# Patient Record
Sex: Female | Born: 1955 | Race: White | Hispanic: No | Marital: Married | Smoking: Never smoker
Health system: Southern US, Community
[De-identification: ages and names within clinical notes are randomized; demographics above are authoritative.]

## PROBLEM LIST (undated history)

## (undated) DIAGNOSIS — J189 Pneumonia, unspecified organism: Secondary | ICD-10-CM

## (undated) DIAGNOSIS — C801 Malignant (primary) neoplasm, unspecified: Secondary | ICD-10-CM

## (undated) DIAGNOSIS — F419 Anxiety disorder, unspecified: Secondary | ICD-10-CM

## (undated) DIAGNOSIS — M2540 Effusion, unspecified joint: Secondary | ICD-10-CM

## (undated) DIAGNOSIS — K5900 Constipation, unspecified: Secondary | ICD-10-CM

## (undated) DIAGNOSIS — M1990 Unspecified osteoarthritis, unspecified site: Secondary | ICD-10-CM

## (undated) DIAGNOSIS — E785 Hyperlipidemia, unspecified: Secondary | ICD-10-CM

## (undated) DIAGNOSIS — Z889 Allergy status to unspecified drugs, medicaments and biological substances status: Secondary | ICD-10-CM

## (undated) DIAGNOSIS — G4700 Insomnia, unspecified: Secondary | ICD-10-CM

## (undated) DIAGNOSIS — M549 Dorsalgia, unspecified: Secondary | ICD-10-CM

## (undated) DIAGNOSIS — R51 Headache: Secondary | ICD-10-CM

## (undated) DIAGNOSIS — J45909 Unspecified asthma, uncomplicated: Secondary | ICD-10-CM

## (undated) DIAGNOSIS — R42 Dizziness and giddiness: Secondary | ICD-10-CM

## (undated) DIAGNOSIS — J40 Bronchitis, not specified as acute or chronic: Secondary | ICD-10-CM

## (undated) DIAGNOSIS — M2550 Pain in unspecified joint: Secondary | ICD-10-CM

## (undated) HISTORY — PX: CARPAL TUNNEL RELEASE: SHX101

## (undated) HISTORY — PX: FLEXIBLE SIGMOIDOSCOPY: SHX1649

## (undated) HISTORY — PX: COLONOSCOPY: SHX174

## (undated) HISTORY — PX: EYE SURGERY: SHX253

## (undated) HISTORY — PX: TONSILLECTOMY: SUR1361

---

## 1999-04-21 ENCOUNTER — Ambulatory Visit (HOSPITAL_BASED_OUTPATIENT_CLINIC_OR_DEPARTMENT_OTHER): Admission: RE | Admit: 1999-04-21 | Discharge: 1999-04-21 | Payer: Self-pay | Admitting: Orthopedic Surgery

## 2004-05-31 ENCOUNTER — Ambulatory Visit (HOSPITAL_COMMUNITY): Admission: RE | Admit: 2004-05-31 | Discharge: 2004-05-31 | Payer: Self-pay | Admitting: Gastroenterology

## 2006-02-17 ENCOUNTER — Encounter: Admission: RE | Admit: 2006-02-17 | Discharge: 2006-02-17 | Payer: Self-pay | Admitting: Orthopedic Surgery

## 2009-06-26 ENCOUNTER — Ambulatory Visit: Payer: Self-pay | Admitting: Gastroenterology

## 2009-07-14 ENCOUNTER — Telehealth: Payer: Self-pay | Admitting: Gastroenterology

## 2009-07-14 ENCOUNTER — Ambulatory Visit: Payer: Self-pay | Admitting: Gastroenterology

## 2009-07-15 ENCOUNTER — Ambulatory Visit (HOSPITAL_COMMUNITY): Admission: RE | Admit: 2009-07-15 | Discharge: 2009-07-15 | Payer: Self-pay | Admitting: Gastroenterology

## 2010-12-19 DIAGNOSIS — J40 Bronchitis, not specified as acute or chronic: Secondary | ICD-10-CM

## 2010-12-19 DIAGNOSIS — J189 Pneumonia, unspecified organism: Secondary | ICD-10-CM

## 2010-12-19 HISTORY — DX: Bronchitis, not specified as acute or chronic: J40

## 2010-12-19 HISTORY — DX: Pneumonia, unspecified organism: J18.9

## 2011-01-09 ENCOUNTER — Encounter: Payer: Self-pay | Admitting: Orthopedic Surgery

## 2011-12-20 DIAGNOSIS — K219 Gastro-esophageal reflux disease without esophagitis: Secondary | ICD-10-CM

## 2011-12-20 HISTORY — DX: Gastro-esophageal reflux disease without esophagitis: K21.9

## 2012-05-21 NOTE — H&P (Signed)
Morgan Johnston 05/21/2012 8:20 AM Location: [facility] Patient #: [id number] DOB: 1956/03/24 Married / Language: [place] / Race: White Female   History of Present Illness(Morgan Johnston Dierdre Highman, PA-C; 05/21/2012 2:20 PM) The patient is a 56 year old female who comes in today for a preoperative History and Physical. 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 Debria Garret D. Shon Baton, MD at [facility] on Thursday, June 07, 2012 at 730AM . Please see the hospital record for complete dictated history and physical.    Allergies(Sharon Gillian Shields; 05/21/2012 10:30 AM) AUGMENTIN. 11/28/2005 CODEINE. 03/25/2005 UPSETS STOMACH   Social History(Sharon Gillian Shields; 05/21/2012 10:31 AM) Tobacco use. Never smoker.   Medication History(Sharon Gillian Shields; 05/21/2012 10:33 AM) Lyrica (75MG  Capsule, 1 Oral two times daily, Taken starting 05/11/2012) Active. (faxed [facility] [phone]) Simvastatin (10MG  Tablet, Oral daily) Active. Allegra Allergy ( Oral) Specific dose unknown - Active. Flexeril (10MG  Tablet, Oral as needed) Active. Calcium 600 ( Oral) Specific dose unknown - Active. Aleve (220MG  Capsule, Oral) Active.   Past Surgical History(Sharon Gillian Shields; 05/21/2012 10:34 AM) Tonsillectomy Carpal Tunnel Surgery - Both   Other Problems(Sharon Gillian Shields; 05/21/2012 10:34 AM) Hypercholesterolemia   Vitals(Sharon Gillian Shields; 05/21/2012 10:30 AM) 05/21/2012 10:29 AM Weight: 135 lb Height: 60 in Body Surface Area: 1.61 m Body Mass Index: 26.37 kg/m Pulse: 65 (Regular) BP: 116/77 (Sitting, Left Arm, Standard)    Physical Exam(Derion Kreiter J Jaedon Siler, PA-C; 05/21/2012 11:28 AM) The physical exam findings are as follows:   General General Appearance- pleasant. Not in acute distress. Orientation- Oriented X3. Build & Nutrition- Well nourished and Well developed. Posture- Normal posture. Gait- Normal. Mental Status-  Alert.   Integumentary General Characteristics:Surgical Scars- no surgical scar evidence of previous cervical surgery. Cervical Spine- Skin examination of the cervical spine is without deformity, skin lesions, lacerations or abrasions.   Head and Neck Neck Global Assessment- supple. no lymphadenopathy and no nucchal rigidty.   Eye Pupil- Bilateral- Normal, Direct reaction to light normal, Equal and Regular. Motion- Bilateral- EOMI.   Chest and Lung Exam Auscultation: Breath sounds:- Clear.   Cardiovascular Auscultation:Rhythm- Regular rate and rhythm. Heart Sounds- Normal heart sounds.   Abdomen Palpation/Percussion:Palpation and Percussion of the abdomen reveal - Non Tender, No Rebound tenderness and Soft.   Peripheral Vascular Upper Extremity: Palpation:Radial pulse- Bilateral- 2+.   Neurologic Sensation:Upper Extremity- Bilateral- sensation is intact in the upper extremity. Reflexes:Biceps Reflex- Bilateral- 2+. Brachioradialis Reflex- Bilateral- 2+. Triceps Reflex- Bilateral- 2+. Patellar Reflex- Bilateral- 2+. Achilles Reflex- Bilateral- 2+. Babinski- Bilateral- Babinski not present. Clonus- Bilateral- clonus not present. Hoffman's Sign- Bilateral- Hoffman's sign not present.   Musculoskeletal Spine/Ribs/Pelvis Cervical Spine : Inspection and Palpation:Tenderness- no bony tenderness to palpation and generalized tenderness about the cervical spine. bony/soft tissue palpation of the cervical spine and shoulders does not recreate their typical pain. Strength and Tone: Strength:Deltoid- Bilateral- 5/5. Biceps- Bilateral- 5/5. Triceps- Bilateral- 5/5. Wrist Extension- Bilateral- 5/5. Hand Grip- Bilateral- 5/5. Heel walk- Bilateral- able to heel walk without difficulty. Toe Walk- Bilateral- able to walk on toes without difficulty. Heel-Toe Walk- Bilateral- able to heel-toe walk without difficulty. ROM-  Flexion- Full. Extension- Full. Left Lateral Flexion - Full. Right Lateral Flexion - Full. Left Rotation - Full. Right Rotation - Full. Pain:- neither flexion or extension is more painful than the other. Special Testing- axial compression test negative and cross chest impingement test negative. Non-Anatomic Signs- No non-anatomic signs present. Upper Extremity Range of Motion:-  No truesholder pain with IR/ER of the shoulders.   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 Risks/benefits/alternatives to surgery/expectations following the procedure have been reviewed with the patient by Dr. Shon Baton.   MRI of the cervical spine dated 03/08/12 demonstrates C3-4: moderate to severe right facet arthrosis. Minimal disc bulge. Minimal right foraminal stenosis. Central canal and left foramina are patent C4-5: mild anterolisthesis of C4 on 5. Moderate to severe left facet arthrosis. Minimal left foraminal narrowing. Central canal and right neural foramina are patent.  C5-6: mild flattening of the ventral thecal sac and ventral cord. Moderate left and mild right foraminal stenosis  She has been fitted for a ASPEN collar. She knows to bring this with her on the morning of surgery. She has not completed her pre-op hospital requirements and is scheduled to do so next week. All of her questions have been encouraged, addressed and answered. Plan, at this time is to proceed with surgery as scheduled.   Signed electronically by Morgan Maine, PA-C (05/21/2012 2:21 PM)  Morgan Johnston 03/20/2012 2:59 PM Location:  [facility] Patient #: [id number] DOB: January 21, 1956 Married / Language: [place] / Race: White Female   History of Present Illness(Sharon Gillian Shields; 03/20/2012 3:01 PM) The patient is a 56 year old female who presents today for follow up of their neck. The patient is being followed for their neck pain. The patient feels that they are doing poorly. The patient presents today following MRI (pre-op). Note for "Follow-up Neck": To discuss surgery in detail    Subjective Transcription(DAHARI Sheela Stack, MD; 03/23/2012 12:44 PM)  She returns today for follow up. I have had an opportunity to review her MRI with her. While there is no significant change from last year's MRI, clinically her symptoms have progressed. Despite injection therapy, physical therapy, observation, pain management she continues to deteriorate. Her quality of life continues to hinder. The patient had cervical spondylitic radiculopathy. It was helped with the C6 nerve block and it has returned.    Allergies(Lori W Lamb; 03/20/2012 3:31 PM) AUGMENTIN. 11/28/2005 CODEINE. 03/25/2005 UPSETS STOMACH   Social History(Lori W Randa Lynn; 03/20/2012 3:31 PM) Tobacco use. Never smoker.   Medication History(Lori W Randa Lynn; 03/20/2012 3:32 PM) Joyce Copa Allergy ( Oral) Specific dose unknown - Active. Simvastatin (10MG  Tablet, Oral) Active. Lyrica (75MG  Capsule, 1 Oral two times daily, Taken 01/16/2012 to 02/15/2012) Inactive.   Past Surgical History(Lori Zipporah Plants; 03/20/2012 3:32 PM) Tonsillectomy   Assessment & Plan(Lori W Randa Lynn; 03/20/2012 3:34 PM) Degeneration, cervical disc (722.4)  Cervicalgia (723.1) Current Plans l Restarted Lyrica 75MG , 1 Capsule two times daily, #60, 30 days starting 03/20/2012, No Refill. l Started Flexeril 10MG , 1 (one) Tablet three times daily, #30, 20 days starting 03/20/2012, No Refill.   Plans Transcription(DAHARI Sheela Stack, MD; 03/23/2012 12:44 PM)  At this point I do think that the  C5-6 ACDF is warranted. Because of the instability pattern she has at C4-5 I would also like to include that as the likelihood of that going on to become problematic in the future in my estimation is significant enough. We have talked about observing it and after discussing with the patient the pros and cons of  two level versus one level she is in favor of proceeding with the two level as am I. We reviewed the risks which include infection, bleeding, nerve damage, death, stroke, paralysis, failure to heal, need for further surgery, ongoing or worse pain, throat pain, swallowing difficulties, hoarseness in the voice, nonunion, adjacent segment disease, need for posterior surgery. All of her questions were encouraged and answered. 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    Because this is a multilevel procedure, I do have concerns about not fusing and so I will use an external bone stimulator after surgery to enhance her fusion rate.    Miscellaneous Transcription(DAHARI Sheela Stack, MD; 03/23/2012 12:44 PM)  Alvy Beal, MD/MMK    T: 03/21/12  D: 03/20/12      Signed electronically by Alvy Beal, MD (03/23/2012 2:39 PM)

## 2012-05-30 ENCOUNTER — Inpatient Hospital Stay (HOSPITAL_COMMUNITY): Admission: RE | Admit: 2012-05-30 | Discharge: 2012-05-30 | Payer: Self-pay | Source: Ambulatory Visit

## 2012-05-30 ENCOUNTER — Encounter (HOSPITAL_COMMUNITY): Payer: Self-pay | Admitting: *Deleted

## 2012-05-30 ENCOUNTER — Encounter (HOSPITAL_COMMUNITY): Payer: Self-pay | Admitting: Respiratory Therapy

## 2012-05-30 NOTE — Progress Notes (Signed)
Requested CXR from [facility] @ [facility]

## 2012-05-30 NOTE — Progress Notes (Signed)
Confirmed lab appointment with pt for 05/31/12 @ 1600

## 2012-05-30 NOTE — Progress Notes (Signed)
In 2003 pt states she had minor chest pain and primary MD sent her to cardiologist with [facility]--stress test/echo were done and everything was normal.Was told that it was anxiety. Occ mild chest pain -last time about a week ago/doesn't have any ntg  Denies ever having a heart cath  Medical MD is [facility] at [facility]  Last EKG many yrs ago CXR in 2012 d/t pneumonia and then had a follow up afterward 2013 @ [employer]

## 2012-05-30 NOTE — Progress Notes (Signed)
Notified Dr.Ross's nurse of pt c/o occasional mild chest pain;wanting to verify that he has been told this by pt and that he is aware that she is going to be having surgery on the 20th.Awaiting return call

## 2012-05-31 ENCOUNTER — Encounter (HOSPITAL_COMMUNITY)
Admission: RE | Admit: 2012-05-31 | Discharge: 2012-05-31 | Disposition: A | Discharge status: Home / Self Care | Payer: BC Managed Care – PPO | Source: Ambulatory Visit | Attending: Orthopedic Surgery | Admitting: Orthopedic Surgery

## 2012-05-31 ENCOUNTER — Encounter (HOSPITAL_COMMUNITY)
Admission: RE | Admit: 2012-05-31 | Discharge: 2012-05-31 | Disposition: A | Discharge status: Home / Self Care | Payer: BC Managed Care – PPO | Source: Ambulatory Visit | Attending: Physician Assistant | Admitting: Physician Assistant

## 2012-05-31 LAB — CBC
HCT: 43.2 % (ref 36.0–46.0)
MCH: 31.1 pg (ref 26.0–34.0)
MCHC: 34.7 g/dL (ref 30.0–36.0)
MCV: 89.6 fL (ref 78.0–100.0)
Platelets: 239 10*3/uL (ref 150–400)
RDW: 12.2 % (ref 11.5–15.5)
WBC: 7.8 10*3/uL (ref 4.0–10.5)

## 2012-05-31 LAB — BASIC METABOLIC PANEL
BUN: 13 mg/dL (ref 6–23)
CO2: 26 mEq/L (ref 19–32)
Calcium: 10 mg/dL (ref 8.4–10.5)
Chloride: 101 mEq/L (ref 96–112)
Creatinine, Ser: 0.71 mg/dL (ref 0.50–1.10)

## 2012-05-31 NOTE — Progress Notes (Signed)
DR LITTLE OFFICE CALLED FOR EKG DONE TODAY PT TO SEE DR TRACY TURNER ON 06/01/12 FOR PROP CLEARANCE. PLEASE CALLAND CHECK FOR NOTES, ANY TEST FIRST OF WEEK

## 2012-05-31 NOTE — Pre-Procedure Instructions (Addendum)
20 Morgan Johnston  05/31/2012   Your procedure is scheduled on: 06/07/12  Report to [facility] at 530 AM.  Call this number if you have problems the morning of surgery: [phone]   Remember:   Do not eat food:OR DRINK After Midnight.  .    Take these medicines the morning of surgery with A SIP OF WATER: flexerill, allegra, lyrica  STOP anaprox   Do not wear jewelry, make-up or nail polish.  Do not wear lotions, powders, or perfumes. You may wear deodorant.  Do not shave 48 hours prior to surgery. Men may shave face and neck.  Do not bring valuables to the hospital.  Contacts, dentures or bridgework may not be worn into surgery.  Leave suitcase in the car. After surgery it may be brought to your room.  For patients admitted to the hospital, checkout time is 11:00 AM the day of discharge.   Patients discharged the day of surgery will not be allowed to drive home.  Name and phone number of your driver:Morgan Johnston [phone]  Special Instructions: Incentive Spirometry - Practice and bring it with you on the day of surgery. and CHG Shower Use Special Wash: 1/2 bottle night before surgery and 1/2 bottle morning of surgery.   Please read over the following fact sheets that you were given: Pain Booklet, Coughing and Deep Breathing, MRSA Information and Surgical Site Infection Prevention

## 2012-06-01 NOTE — Consult Note (Addendum)
Anesthesia Chart Review:  Patient is a 55 year old female scheduled for C4-5, C5-6 ACDF on 06/07/12 by Dr. Shon Baton.  History includes non-smoker, HLD, PNA '12, anxiety, dizziness, insomnia, headaches, arthritis, back pain.  During her phone interview earlier this week she also reported occasional mild chest pain.  Her PCP Dr. Duane Lope was notified, and she is now scheduled to see Cardiologist Dr. Mayford Knife on 06/01/12.    Labs noted.  CXR from 10/10/11 [facility]) showed no acute findings.  EKG on 05/31/12 [facility]) showed NSR, nonspecific T wave changes in III, aVF.  Will follow-up on Cardiology records when available.  Addendum: 06/05/12 1000  Reviewed 06/01/12 note from Dr. Mayford Knife.  She ordered a stress test.  I called [facility], and they report that a stress test is being done on 06/05/12 @ 1300.  I updated Sherrie at Dr. Shon Baton office so she can follow-up on clearance.  Addendum: 06/06/12 1340  The final stress test report from 06/05/12 is not yet available; however, Dr. Mayford Knife did send a note of clearance for the planned surgery.  [facility] will fax the final stress test report if available before patient's surgery.  Shonna Chock, PA-C

## 2012-06-04 NOTE — Progress Notes (Signed)
Requested cardiac clearance,notes and any studies from Dr.Turner's office.

## 2012-06-06 MED ORDER — VANCOMYCIN HCL IN DEXTROSE 1-5 GM/200ML-% IV SOLN
1000.0000 mg | INTRAVENOUS | Status: AC
  Administered 2012-06-07: 1000 mg via INTRAVENOUS
  Filled 2012-06-06: qty 200

## 2012-06-07 ENCOUNTER — Ambulatory Visit (HOSPITAL_COMMUNITY): Payer: BC Managed Care – PPO

## 2012-06-07 ENCOUNTER — Encounter (HOSPITAL_COMMUNITY)
Admission: RE | Disposition: A | Discharge status: Home / Self Care | Payer: Self-pay | Source: Ambulatory Visit | Attending: Orthopedic Surgery

## 2012-06-07 ENCOUNTER — Ambulatory Visit (HOSPITAL_COMMUNITY)
Admission: RE | Admit: 2012-06-07 | Discharge: 2012-06-08 | Disposition: A | Discharge status: Home / Self Care | Payer: BC Managed Care – PPO | Source: Ambulatory Visit | Attending: Orthopedic Surgery | Admitting: Orthopedic Surgery

## 2012-06-07 ENCOUNTER — Encounter (HOSPITAL_COMMUNITY): Payer: Self-pay | Admitting: Vascular Surgery

## 2012-06-07 ENCOUNTER — Ambulatory Visit (HOSPITAL_COMMUNITY): Payer: BC Managed Care – PPO | Admitting: Vascular Surgery

## 2012-06-07 DIAGNOSIS — Z01812 Encounter for preprocedural laboratory examination: Secondary | ICD-10-CM | POA: Insufficient documentation

## 2012-06-07 DIAGNOSIS — M5030 Other cervical disc degeneration, unspecified cervical region: Secondary | ICD-10-CM | POA: Insufficient documentation

## 2012-06-07 DIAGNOSIS — Z23 Encounter for immunization: Secondary | ICD-10-CM | POA: Insufficient documentation

## 2012-06-07 DIAGNOSIS — M47812 Spondylosis without myelopathy or radiculopathy, cervical region: Principal | ICD-10-CM | POA: Insufficient documentation

## 2012-06-07 HISTORY — DX: Unspecified osteoarthritis, unspecified site: M19.90

## 2012-06-07 HISTORY — DX: Allergy status to unspecified drugs, medicaments and biological substances: Z88.9

## 2012-06-07 HISTORY — PX: ANTERIOR CERVICAL DECOMP/DISCECTOMY FUSION: SHX1161

## 2012-06-07 HISTORY — DX: Bronchitis, not specified as acute or chronic: J40

## 2012-06-07 HISTORY — DX: Headache: R51

## 2012-06-07 HISTORY — DX: Effusion, unspecified joint: M25.40

## 2012-06-07 HISTORY — DX: Insomnia, unspecified: G47.00

## 2012-06-07 HISTORY — DX: Hyperlipidemia, unspecified: E78.5

## 2012-06-07 HISTORY — DX: Pneumonia, unspecified organism: J18.9

## 2012-06-07 HISTORY — DX: Pain in unspecified joint: M25.50

## 2012-06-07 HISTORY — DX: Dorsalgia, unspecified: M54.9

## 2012-06-07 HISTORY — DX: Constipation, unspecified: K59.00

## 2012-06-07 HISTORY — DX: Anxiety disorder, unspecified: F41.9

## 2012-06-07 HISTORY — DX: Dizziness and giddiness: R42

## 2012-06-07 SURGERY — ANTERIOR CERVICAL DECOMPRESSION/DISCECTOMY FUSION 2 LEVELS
Anesthesia: General | Site: Neck | Wound class: Clean

## 2012-06-07 MED ORDER — HYDROMORPHONE HCL PF 1 MG/ML IJ SOLN
0.2500 mg | INTRAMUSCULAR | Status: DC | PRN
  Administered 2012-06-07 (×2): 0.5 mg via INTRAVENOUS

## 2012-06-07 MED ORDER — LACTATED RINGERS IV SOLN
INTRAVENOUS | Status: DC | PRN
  Administered 2012-06-07 (×2): via INTRAVENOUS

## 2012-06-07 MED ORDER — PROPOFOL 10 MG/ML IV EMUL
INTRAVENOUS | Status: DC | PRN
  Administered 2012-06-07: 150 mg via INTRAVENOUS
  Administered 2012-06-07: 20 mg via INTRAVENOUS

## 2012-06-07 MED ORDER — ACETAMINOPHEN 10 MG/ML IV SOLN
1000.0000 mg | Freq: Once | INTRAVENOUS | Status: AC
  Administered 2012-06-07: 1000 mg via INTRAVENOUS

## 2012-06-07 MED ORDER — MENTHOL 3 MG MT LOZG
1.0000 | LOZENGE | OROMUCOSAL | Status: DC | PRN
  Administered 2012-06-07: 3 mg via ORAL
  Filled 2012-06-07: qty 9

## 2012-06-07 MED ORDER — THROMBIN 20000 UNITS EX SOLR
CUTANEOUS | Status: AC
  Filled 2012-06-07: qty 20000

## 2012-06-07 MED ORDER — CYCLOBENZAPRINE HCL 10 MG PO TABS
10.0000 mg | ORAL_TABLET | Freq: Three times a day (TID) | ORAL | Status: DC | PRN
  Administered 2012-06-07: 10 mg via ORAL
  Filled 2012-06-07: qty 1

## 2012-06-07 MED ORDER — SODIUM CHLORIDE 0.9 % IV SOLN: 250.0000 mL | INTRAVENOUS | Status: DC

## 2012-06-07 MED ORDER — LIDOCAINE HCL (CARDIAC) 20 MG/ML IV SOLN
INTRAVENOUS | Status: DC | PRN
  Administered 2012-06-07: 100 mg via INTRAVENOUS

## 2012-06-07 MED ORDER — 0.9 % SODIUM CHLORIDE (POUR BTL) OPTIME
TOPICAL | Status: DC | PRN
  Administered 2012-06-07: 1000 mL

## 2012-06-07 MED ORDER — THROMBIN 20000 UNITS EX KIT
PACK | OROMUCOSAL | Status: DC | PRN
  Administered 2012-06-07: 08:00:00 via TOPICAL

## 2012-06-07 MED ORDER — VECURONIUM BROMIDE 10 MG IV SOLR
INTRAVENOUS | Status: DC | PRN
  Administered 2012-06-07: 3 mg via INTRAVENOUS
  Administered 2012-06-07: 1 mg via INTRAVENOUS

## 2012-06-07 MED ORDER — PHENYLEPHRINE HCL 10 MG/ML IJ SOLN
INTRAMUSCULAR | Status: DC | PRN
  Administered 2012-06-07 (×4): 40 ug via INTRAVENOUS

## 2012-06-07 MED ORDER — BUPIVACAINE-EPINEPHRINE 0.25% -1:200000 IJ SOLN
INTRAMUSCULAR | Status: DC | PRN
  Administered 2012-06-07: 5 mL

## 2012-06-07 MED ORDER — ONDANSETRON HCL 4 MG/2ML IJ SOLN
INTRAMUSCULAR | Status: DC | PRN
  Administered 2012-06-07: 4 mg via INTRAVENOUS

## 2012-06-07 MED ORDER — DEXAMETHASONE SODIUM PHOSPHATE 10 MG/ML IJ SOLN
10.0000 mg | Freq: Once | INTRAMUSCULAR | Status: AC
  Administered 2012-06-07: 10 mg via INTRAVENOUS
  Filled 2012-06-07: qty 1

## 2012-06-07 MED ORDER — LIDOCAINE HCL 4 % MT SOLN
OROMUCOSAL | Status: DC | PRN
  Administered 2012-06-07: 4 mL via TOPICAL

## 2012-06-07 MED ORDER — SIMVASTATIN 20 MG PO TABS
20.0000 mg | ORAL_TABLET | Freq: Every day | ORAL | Status: DC
  Administered 2012-06-07: 20 mg via ORAL
  Filled 2012-06-07 (×2): qty 1

## 2012-06-07 MED ORDER — SODIUM CHLORIDE 0.9 % IJ SOLN: 3.0000 mL | Freq: Two times a day (BID) | INTRAMUSCULAR | Status: DC

## 2012-06-07 MED ORDER — MIDAZOLAM HCL 5 MG/5ML IJ SOLN
INTRAMUSCULAR | Status: DC | PRN
  Administered 2012-06-07 (×2): 1 mg via INTRAVENOUS

## 2012-06-07 MED ORDER — ROCURONIUM BROMIDE 100 MG/10ML IV SOLN
INTRAVENOUS | Status: DC | PRN
  Administered 2012-06-07: 50 mg via INTRAVENOUS

## 2012-06-07 MED ORDER — PANTOPRAZOLE SODIUM 40 MG IV SOLR
40.0000 mg | Freq: Every day | INTRAVENOUS | Status: DC
  Administered 2012-06-07: 40 mg via INTRAVENOUS
  Filled 2012-06-07 (×2): qty 40

## 2012-06-07 MED ORDER — ONDANSETRON HCL 4 MG/2ML IJ SOLN: 4.0000 mg | INTRAMUSCULAR | Status: DC | PRN

## 2012-06-07 MED ORDER — BUPIVACAINE-EPINEPHRINE PF 0.25-1:200000 % IJ SOLN
INTRAMUSCULAR | Status: AC
  Filled 2012-06-07: qty 30

## 2012-06-07 MED ORDER — PREGABALIN 75 MG PO CAPS
75.0000 mg | ORAL_CAPSULE | Freq: Two times a day (BID) | ORAL | Status: DC
  Administered 2012-06-07 – 2012-06-08 (×2): 75 mg via ORAL
  Filled 2012-06-07 (×2): qty 1

## 2012-06-07 MED ORDER — ACETAMINOPHEN 10 MG/ML IV SOLN
INTRAVENOUS | Status: AC
  Filled 2012-06-07: qty 100

## 2012-06-07 MED ORDER — ZOLPIDEM TARTRATE 10 MG PO TABS: 10.0000 mg | ORAL_TABLET | Freq: Every evening | ORAL | Status: DC | PRN

## 2012-06-07 MED ORDER — GLYCOPYRROLATE 0.2 MG/ML IJ SOLN
INTRAMUSCULAR | Status: DC | PRN
  Administered 2012-06-07: .5 mg via INTRAVENOUS

## 2012-06-07 MED ORDER — MIDAZOLAM HCL 2 MG/2ML IJ SOLN: 1.0000 mg | INTRAMUSCULAR | Status: DC | PRN

## 2012-06-07 MED ORDER — DEXAMETHASONE 4 MG PO TABS
4.0000 mg | ORAL_TABLET | Freq: Four times a day (QID) | ORAL | Status: DC
  Filled 2012-06-07 (×8): qty 1

## 2012-06-07 MED ORDER — PNEUMOCOCCAL VAC POLYVALENT 25 MCG/0.5ML IJ INJ
0.5000 mL | INJECTION | INTRAMUSCULAR | Status: AC
  Administered 2012-06-08: 0.5 mL via INTRAMUSCULAR
  Filled 2012-06-07: qty 0.5

## 2012-06-07 MED ORDER — FENTANYL CITRATE 0.05 MG/ML IJ SOLN
INTRAMUSCULAR | Status: DC | PRN
  Administered 2012-06-07 (×5): 50 ug via INTRAVENOUS

## 2012-06-07 MED ORDER — OXYCODONE HCL 5 MG PO TABS
10.0000 mg | ORAL_TABLET | ORAL | Status: DC | PRN
  Administered 2012-06-07: 10 mg via ORAL

## 2012-06-07 MED ORDER — LORAZEPAM 2 MG/ML IJ SOLN: 1.0000 mg | Freq: Once | INTRAMUSCULAR | Status: DC | PRN

## 2012-06-07 MED ORDER — VANCOMYCIN HCL IN DEXTROSE 1-5 GM/200ML-% IV SOLN
1000.0000 mg | Freq: Two times a day (BID) | INTRAVENOUS | Status: DC
  Administered 2012-06-07 – 2012-06-08 (×2): 1000 mg via INTRAVENOUS
  Filled 2012-06-07 (×3): qty 200

## 2012-06-07 MED ORDER — FENTANYL CITRATE 0.05 MG/ML IJ SOLN: 50.0000 ug | INTRAMUSCULAR | Status: DC | PRN

## 2012-06-07 MED ORDER — FLEET ENEMA 7-19 GM/118ML RE ENEM: 1.0000 | ENEMA | Freq: Once | RECTAL | Status: AC | PRN

## 2012-06-07 MED ORDER — SODIUM CHLORIDE 0.9 % IJ SOLN: 3.0000 mL | INTRAMUSCULAR | Status: DC | PRN

## 2012-06-07 MED ORDER — DEXAMETHASONE SODIUM PHOSPHATE 4 MG/ML IJ SOLN
4.0000 mg | Freq: Four times a day (QID) | INTRAMUSCULAR | Status: DC
  Administered 2012-06-07 – 2012-06-08 (×4): 4 mg via INTRAVENOUS
  Filled 2012-06-07 (×8): qty 1

## 2012-06-07 MED ORDER — DOCUSATE SODIUM 100 MG PO CAPS
100.0000 mg | ORAL_CAPSULE | Freq: Two times a day (BID) | ORAL | Status: DC
  Administered 2012-06-07 – 2012-06-08 (×2): 100 mg via ORAL
  Filled 2012-06-07 (×3): qty 1

## 2012-06-07 MED ORDER — TRAMADOL HCL 50 MG PO TABS
50.0000 mg | ORAL_TABLET | ORAL | Status: DC | PRN
  Administered 2012-06-07 (×2): 100 mg via ORAL
  Filled 2012-06-07 (×2): qty 2

## 2012-06-07 MED ORDER — LACTATED RINGERS IV SOLN: INTRAVENOUS | Status: DC

## 2012-06-07 MED ORDER — NEOSTIGMINE METHYLSULFATE 1 MG/ML IJ SOLN
INTRAMUSCULAR | Status: DC | PRN
  Administered 2012-06-07: 3 mg via INTRAVENOUS

## 2012-06-07 MED ORDER — PHENOL 1.4 % MT LIQD: 1.0000 | OROMUCOSAL | Status: DC | PRN

## 2012-06-07 MED ORDER — ACETAMINOPHEN 10 MG/ML IV SOLN
1000.0000 mg | Freq: Four times a day (QID) | INTRAVENOUS | Status: AC
  Administered 2012-06-07 – 2012-06-08 (×4): 1000 mg via INTRAVENOUS
  Filled 2012-06-07 (×4): qty 100

## 2012-06-07 SURGICAL SUPPLY — 65 items
ADH SKN CLS APL DERMABOND .7 (GAUZE/BANDAGES/DRESSINGS) ×1
BLADE SURG ROTATE 9660 (MISCELLANEOUS) IMPLANT
BUR EGG ELITE 4.0 (BURR) IMPLANT
BUR MATCHSTICK NEURO 3.0 LAGG (BURR) IMPLANT
CANISTER SUCTION 2500CC (MISCELLANEOUS) ×2 IMPLANT
CLOTH BEACON ORANGE TIMEOUT ST (SAFETY) ×2 IMPLANT
COLLAR CERV LO CONTOUR FIRM DE (SOFTGOODS) IMPLANT
CORDS BIPOLAR (ELECTRODE) ×2 IMPLANT
COVER SURGICAL LIGHT HANDLE (MISCELLANEOUS) ×4 IMPLANT
CRADLE DONUT ADULT HEAD (MISCELLANEOUS) ×2 IMPLANT
DERMABOND ADVANCED (GAUZE/BANDAGES/DRESSINGS) ×1
DERMABOND ADVANCED .7 DNX12 (GAUZE/BANDAGES/DRESSINGS) ×1 IMPLANT
DRAPE C-ARM 42X72 X-RAY (DRAPES) ×2 IMPLANT
DRAPE POUCH INSTRU U-SHP 10X18 (DRAPES) ×2 IMPLANT
DRAPE SURG 17X23 STRL (DRAPES) ×2 IMPLANT
DRAPE U-SHAPE 47X51 STRL (DRAPES) ×2 IMPLANT
DRILL BIT (BIT) ×1 IMPLANT
DRSG MEPILEX BORDER 4X4 (GAUZE/BANDAGES/DRESSINGS) ×2 IMPLANT
DURAPREP 26ML APPLICATOR (WOUND CARE) ×2 IMPLANT
ELECT COATED BLADE 2.86 ST (ELECTRODE) ×2 IMPLANT
ELECT REM PT RETURN 9FT ADLT (ELECTROSURGICAL) ×2
ELECTRODE REM PT RTRN 9FT ADLT (ELECTROSURGICAL) ×1 IMPLANT
GLOVE BIOGEL PI IND STRL 6.5 (GLOVE) ×1 IMPLANT
GLOVE BIOGEL PI IND STRL 8.5 (GLOVE) ×1 IMPLANT
GLOVE BIOGEL PI INDICATOR 6.5 (GLOVE) ×1
GLOVE BIOGEL PI INDICATOR 8.5 (GLOVE) ×1
GLOVE ECLIPSE 6.0 STRL STRAW (GLOVE) ×2 IMPLANT
GLOVE ECLIPSE 8.5 STRL (GLOVE) ×2 IMPLANT
GOWN PREVENTION PLUS XXLARGE (GOWN DISPOSABLE) ×2 IMPLANT
GOWN STRL NON-REIN LRG LVL3 (GOWN DISPOSABLE) ×4 IMPLANT
INTERLOCK LRDTC CRVCL VBR 6MM (Bone Implant) IMPLANT
KIT BASIN OR (CUSTOM PROCEDURE TRAY) ×2 IMPLANT
KIT ROOM TURNOVER OR (KITS) ×2 IMPLANT
LORDOTIC CERVICAL VBR 6MM SM (Bone Implant) ×4 IMPLANT
NDL SPNL 18GX3.5 QUINCKE PK (NEEDLE) ×1 IMPLANT
NEEDLE SPNL 18GX3.5 QUINCKE PK (NEEDLE) ×2 IMPLANT
NS IRRIG 1000ML POUR BTL (IV SOLUTION) ×2 IMPLANT
PACK ORTHO CERVICAL (CUSTOM PROCEDURE TRAY) ×2 IMPLANT
PACK UNIVERSAL I (CUSTOM PROCEDURE TRAY) ×2 IMPLANT
PAD ARMBOARD 7.5X6 YLW CONV (MISCELLANEOUS) ×4 IMPLANT
PIN DISTRACTION 14 (PIN) ×2 IMPLANT
PIN FIXATION TEMP (PIN) ×1 IMPLANT
PLATE LEVEL 2 26MM (Plate) ×1 IMPLANT
PUTTY BONE DBX 5CC MIX (Putty) ×1 IMPLANT
SCREW 4.0X14MM (Screw) ×12 IMPLANT
SCREW BN 14X4XSLF DRL VA SLF (Screw) IMPLANT
SPONGE INTESTINAL PEANUT (DISPOSABLE) ×1 IMPLANT
SPONGE LAP 4X18 X RAY DECT (DISPOSABLE) IMPLANT
SPONGE SURGIFOAM ABS GEL 100 (HEMOSTASIS) ×2 IMPLANT
STRIP CLOSURE SKIN 1/2X4 (GAUZE/BANDAGES/DRESSINGS) ×2 IMPLANT
SURGIFLO TRUKIT (HEMOSTASIS) IMPLANT
SUT MNCRL AB 3-0 PS2 18 (SUTURE) ×2 IMPLANT
SUT SILK 2 0 (SUTURE) ×2
SUT SILK 2-0 18XBRD TIE 12 (SUTURE) ×1 IMPLANT
SUT VIC AB 2-0 CT1 18 (SUTURE) ×2 IMPLANT
SUT VIC AB 3-0 54X BRD REEL (SUTURE) IMPLANT
SUT VIC AB 3-0 BRD 54 (SUTURE)
SYR BULB IRRIGATION 50ML (SYRINGE) ×2 IMPLANT
SYR CONTROL 10ML LL (SYRINGE) ×2 IMPLANT
TAPE CLOTH 4X10 WHT NS (GAUZE/BANDAGES/DRESSINGS) ×2 IMPLANT
TAPE UMBILICAL COTTON 1/8X30 (MISCELLANEOUS) ×2 IMPLANT
TOWEL OR 17X24 6PK STRL BLUE (TOWEL DISPOSABLE) ×2 IMPLANT
TOWEL OR 17X26 10 PK STRL BLUE (TOWEL DISPOSABLE) ×2 IMPLANT
TRAY FOLEY CATH 14FR (SET/KITS/TRAYS/PACK) IMPLANT
WATER STERILE IRR 1000ML POUR (IV SOLUTION) ×2 IMPLANT

## 2012-06-07 NOTE — Transfer of Care (Signed)
Immediate Anesthesia Transfer of Care Note  Patient: Morgan Johnston  Procedure(s) Performed: Procedure(s) (LRB): ANTERIOR CERVICAL DECOMPRESSION/DISCECTOMY FUSION 2 LEVELS (N/A)  Patient Location: PACU  Anesthesia Type: General  Level of Consciousness: awake, oriented, patient cooperative and responds to stimulation  Airway & Oxygen Therapy: Patient Spontanous Breathing and Patient connected to nasal cannula oxygen  Post-op Assessment: Report given to PACU RN and Post -op Vital signs reviewed and stable  Post vital signs: Reviewed  Complications: No apparent anesthesia complications

## 2012-06-07 NOTE — Op Note (Signed)
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  OPERATIVE PROCEDURE:  Anterior cervical diskectomy and fusion C4-5, C5- 6.  COMPLICATIONS:  None.  INSTRUMENTATION SYSTEM USED:  Size 6 small titanium intervertebral graft packed with DBX mix with a 26 mm anterior cervical plate, Vectra with 14 mm locking screws variable angle.  FIRST ASSISTANT:  Norval Gable, PA  HISTORY:  This is a very pleasant woman who has been having progressive debilitating neck and radicular arm pain.  Despite attempts at conservative management, she continued to have neck pain, predominantly C6 and some C5 radicular left arm pain.  X-rays demonstrate anterolisthesis C4-5 and advanced degenerative disk disease at C5-6. After discussing all appropriate treatment options, she elected to proceed with surgery.  All appropriate risks, benefits, and alternatives were discussed.  OPERATIVE NOTE:  The patient was brought to the operating room, placed supine on the operating table.  After successful induction of general anesthesia and endotracheal intubation, TEDs and SCDs were applied.  The patient was placed supine on the surgical table and towels were placed between the shoulder blades and the shoulders were taped down.  Anterior cervical spine was prepped and draped in a standard fashion. Appropriate time-out was then done to confirm patient, procedure, and all other pertinent  important data.  Once this was done, a transverse incision was made on the left-hand side, centered over the C5 vertebral body.  Sharp dissection was carried out down to and through the platysma.  I then bluntly dissected through the deep cervical and prevertebral fascia to mobilize the esophagus and trachea.  I palpated the carotid artery pulse and protected this with a finger.  I then mobilized the omohyoid muscle and was able to place a thyroid retractor, and visualize the anterior cervical spine.  Once this was done, I placed a needle into the C4-5 disk space and took an x-ray to confirm that I was at the appropriate level.  Once I confirmed that I was at the 4-5 level, I then used bipolar electrocautery to mobilize the longus colli muscles from the midbody of C4 to the midbody of C6.  This allowed me to place the [facility] retracting blades underneath the longus colli muscles and expand the retractor blades.  I did deflate and reinflate the endotracheal cuff during this procedure.  Distraction pins were placed into the bodies of C5 and C6 and I distracted the C5-6 disk space.  I then used a combination of pituitary rongeurs, curettes, and Kerrison rongeurs to remove all the disk material.  I was able to continue to distract the disk space and so I was able to resect the posterior aspect of the anulus.  I then used  a small nerve hook to generate a plane behind the posterior longitudinal ligament and then used a 1-mm Kerrison to release the posterior longitudinal ligament.  At this point, I had an adequate decompression centrally and under the uncovertebral joints.  I also was able to restore a significant amount of the disk space height.  I then measured with trial devices and decided to use small 6 mm lordotic spacer packed with DBX mix.  I had excellent fixation with this.  I then repositioned my distraction pins and retractor to expose the C4-5 disk space.  There was a defect on the  superior surface of the C5 vertebral body, but I was able to get some reduction and improvement of the listhesis.  Using the same technique that I used at 5-6, I completed the diskectomy at C4-5.  I placed the same size trial and same size implant at this level as well.  Because of that slight cortical deformity, I did pack some extra graft to improve my fusion.  I then removed the distraction pins and then placed a 26-mm anterior cervical plate.  This had excellent purchase.  This had excellent positioning and so I secured it to the bodies of C4 and C6 with 14 mm variable angle screws.  I then placed same sized screws into the body of C5.  X-rays were taken, which demonstrated satisfactory position of my hardware and intervertebral graft.  At this point, with the decompression and fusion complete, I then checked the esophagus to ensure that it did not become entrapped beneath the plate.  I irrigated the wound copiously with normal saline, and then obtained hemostasis using bipolar electrocautery.  Trachea and esophagus were then returned back to the midline and then I closed the platysma with interrupted 2-0 Vicryl suture.  I then used 3-0 Monocryl suture to close the skin.  Steri-Strips, dry dressing, and an Aspen collar were applied.  The patient was then extubated, transferred to the PACU without incident.  At the end of the case, all needle and sponge counts were correct.     Alvy Beal, MD     DDB/MEDQ  D:  06/07/2012  T:  06/07/2012  Job:  [id number]

## 2012-06-07 NOTE — Progress Notes (Signed)
xrays reviewed - hardware satisfactory No complications noted

## 2012-06-07 NOTE — Anesthesia Preprocedure Evaluation (Addendum)
Anesthesia Evaluation  Patient identified by MRN, date of birth, ID band Patient awake    Reviewed: Allergy & Precautions, H&P , NPO status , Patient's Chart, lab work & pertinent test results  Airway Mallampati: I TM Distance: >3 FB Neck ROM: Full    Dental  (+) Dental Advisory Given,    Pulmonary    Pulmonary exam normal       Cardiovascular     Neuro/Psych  Headaches,  Neuromuscular disease    GI/Hepatic   Endo/Other    Renal/GU      Musculoskeletal   Abdominal   Peds  Hematology   Anesthesia Other Findings   Reproductive/Obstetrics                          Anesthesia Physical Anesthesia Plan  ASA: II  Anesthesia Plan: General   Post-op Pain Management:    Induction: Intravenous  Airway Management Planned: Oral ETT  Additional Equipment:   Intra-op Plan:   Post-operative Plan: Extubation in OR  Informed Consent: I have reviewed the patients History and Physical, chart, labs and discussed the procedure including the risks, benefits and alternatives for the proposed anesthesia with the patient or authorized representative who has indicated his/her understanding and acceptance.     Plan Discussed with: CRNA and Surgeon  Anesthesia Plan Comments:         Anesthesia Quick Evaluation

## 2012-06-07 NOTE — Preoperative (Signed)
Beta Blockers   Reason not to administer Beta Blockers:Not Applicable 

## 2012-06-07 NOTE — H&P (Signed)
No change in  Clinical exam H+P reviewed

## 2012-06-07 NOTE — Anesthesia Postprocedure Evaluation (Signed)
  Anesthesia Post-op Note  Patient: Morgan Johnston  Procedure(s) Performed: Procedure(s) (LRB): ANTERIOR CERVICAL DECOMPRESSION/DISCECTOMY FUSION 2 LEVELS (N/A)  Patient Location: PACU  Anesthesia Type: General  Level of Consciousness: awake and alert   Airway and Oxygen Therapy: Patient Spontanous Breathing  Post-op Pain: mild  Post-op Assessment: Post-op Vital signs reviewed, Patient's Cardiovascular Status Stable, Respiratory Function Stable, Patent Airway, No signs of Nausea or vomiting and Pain level controlled  Post-op Vital Signs: stable  Complications: No apparent anesthesia complications

## 2012-06-07 NOTE — Brief Op Note (Signed)
06/07/2012  9:51 AM  PATIENT:  Morgan Johnston  56 y.o. female  PRE-OPERATIVE DIAGNOSIS:  CERVICAL SPONDYLITIC RADICULOPATHY  POST-OPERATIVE DIAGNOSIS:  cervical spondylitic radiculopathy  PROCEDURE:  Procedure(s) (LRB): ANTERIOR CERVICAL DECOMPRESSION/DISCECTOMY FUSION 2 LEVELS (N/A)  SURGEON:  Surgeon(s) and Role:    * Venita Lick, MD - Primary  PHYSICIAN ASSISTANT:   ASSISTANTS: Norval Gable   ANESTHESIA:   general  EBL:  Total I/O In: 1000 [I.V.:1000] Out: -   BLOOD ADMINISTERED: none  DRAINS: none   LOCAL MEDICATIONS USED:  MARCAINE     SPECIMEN:  No Specimen  DISPOSITION OF SPECIMEN:  N/A  COUNTS:  YES  TOURNIQUET:  * No tourniquets in log *  DICTATION: .Other Dictation: Dictation Number [id number]  PLAN OF CARE: Admit for overnight observation  PATIENT DISPOSITION:  PACU - hemodynamically stable.

## 2012-06-08 ENCOUNTER — Encounter (HOSPITAL_COMMUNITY): Payer: Self-pay | Admitting: Orthopedic Surgery

## 2012-06-08 MED ORDER — CYCLOBENZAPRINE HCL 10 MG PO TABS: 10.0000 mg | ORAL_TABLET | Freq: Three times a day (TID) | ORAL | Status: AC

## 2012-06-08 MED ORDER — POLYETHYLENE GLYCOL 3350 17 G PO PACK: 17.0000 g | PACK | Freq: Every day | ORAL | Status: AC

## 2012-06-08 MED ORDER — TRAMADOL HCL 50 MG PO TABS: 50.0000 mg | ORAL_TABLET | Freq: Every day | ORAL | Status: AC

## 2012-06-08 MED ORDER — ONDANSETRON HCL 4 MG PO TABS: 4.0000 mg | ORAL_TABLET | Freq: Three times a day (TID) | ORAL | Status: AC | PRN

## 2012-06-08 NOTE — Discharge Summary (Signed)
Patient ID: Morgan Johnston MRN: [id number] DOB/AGE: December 19, 1956 56 y.o.  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 Hyperlipidemia     takes Simvastatin nightly  . Pneumonia 2012  . Bronchitis 2012  . Hx of seasonal allergies     takes Allegra daily  . Headache     occasionally  . Dizziness     r/t menstrual cycle;was given Meclizine and no problems in over a yr  . Arthritis   . Joint pain   . Joint swelling   . Back pain     DDD  . Constipation   . Anxiety     but doesn't require any meds  . Insomnia     d/t neck pain and menopause    Surgeries: Procedure(s): ANTERIOR CERVICAL DECOMPRESSION/DISCECTOMY FUSION 2 LEVELS on 06/07/2012   Consultants: none  Discharged Condition: Improved  Hospital Course: Morgan Johnston is an 56 y.o. female who was admitted 06/07/2012 for operative treatment of two-level degenerative cervical spondylotic radiculopathy with degenerative disk disease and anterolisthesis.  Patient failed conservative treatments (please see the history and physical for the specifics) and had severe unremitting pain that affects sleep, daily activities and work/hobbies. After pre-op clearance, the patient was taken to the operating room on 06/07/2012 and underwent  Procedure(s): ANTERIOR CERVICAL DECOMPRESSION/DISCECTOMY FUSION 2 LEVELS.    Patient was given perioperative antibiotics: Anti-infectives     Start     Dose/Rate Route Frequency Ordered Stop   06/07/12 2000   vancomycin (VANCOCIN) IVPB 1000 mg/200 mL premix        1,000 mg 200 mL/hr over 60 Minutes Intravenous  Every 12 hours 06/07/12 1155     06/06/12 1413   vancomycin (VANCOCIN) IVPB 1000 mg/200 mL premix        1,000 mg 200 mL/hr over 60 Minutes Intravenous 60 min pre-op 06/06/12 1413 06/07/12 0720           Patient was given sequential compression devices and early ambulation to prevent DVT.   Patient benefited maximally from hospital stay and there were no complications. At the time of discharge, the patient was urinating/moving their bowels without difficulty, tolerating a regular diet, pain is controlled with oral pain medications and they have been cleared by PT/OT.   Recent vital signs: Patient Vitals for the past 24 hrs:  BP Temp Temp src Pulse Resp SpO2 Height Weight  06/08/12 0521 107/57 mmHg 97.2 F (36.2 C) - 74  18  98 % - -  06/07/12 2156 113/50 mmHg 97.7 F (36.5 C) Oral 88  18  97 % - -  06/07/12 1400 110/72 mmHg 98.4 F (36.9 C) - 83  18  95 % - -  06/07/12 1205 117/60 mmHg 97.7 F (36.5 C) - 87  12  92 % 5' (1.524 m) 61.689 kg (136 lb)  06/07/12 1121 - - - 65  13  99 % - -  06/07/12 1120 - - - 65  13  99 % - -  06/07/12 1119 - - - 66  13  99 % - -  06/07/12 1118 - - - 62  13  99 % - -  06/07/12 1117 - 97.6 F (36.4 C) -  64  13  99 % - -  06/07/12 1116 - - - 67  13  99 % - -  06/07/12 1115 106/64 mmHg - - 64  13  100 % - -  06/07/12 1114 - - - 65  14  99 % - -  06/07/12 1113 - - - 66  14  99 % - -  06/07/12 1112 - - - 64  14  99 % - -  06/07/12 1111 - - - 66  14  100 % - -  06/07/12 1110 - - - 62  14  100 % - -  06/07/12 1109 - - - 61  13  99 % - -  06/07/12 1108 - - - 69  13  99 % - -  06/07/12 1107 - - - 61  13  99 % - -  06/07/12 1106 - - - 61  14  99 % - -  06/07/12 1105 - - - 68  15  99 % - -  06/07/12 1104 - - - 65  15  98 % - -  06/07/12 1103 - - - 61  12  99 % - -  06/07/12 1102 - - - 65  15  99 % - -  06/07/12 1101 - - - 66  15  100 % - -  06/07/12 1100 122/64 mmHg - - 62  15  100 % - -  06/07/12 1059 - - - 78  18  100 % - -  06/07/12 1058 - - - 79   21  100 % - -  06/07/12 1057 - - - 69  16  100 % - -  06/07/12 1056 - - - 63  17  100 % - -  06/07/12 1055 - - - 71  16  100 % - -  06/07/12 1054 - - - 70  15  100 % - -  06/07/12 1053 - - - 67  16  100 % - -  06/07/12 1052 - - - 61  16  100 % - -  06/07/12 1051 - - - 63  15  100 % - -  06/07/12 1045 123/69 mmHg - - 60  - 100 % - -  06/07/12 1015 - 97.8 F (36.6 C) - - - - - -     Recent laboratory studies: No results found for this basename: WBC:2,HGB:2,HCT:2,PLT:2,NA:2,K:2,CL:2,CO2:2,BUN:2,CREATININE:2,GLUCOSE:2,PT:2,INR:2,CALCIUM,2: in the last 72 hours   Discharge Medications:   Medication List  As of 06/08/2012  7:43 AM   STOP taking these medications         naproxen sodium 220 MG tablet         TAKE these medications         CALCIUM 600 PO   Take 1,200 mg by mouth daily.      cyclobenzaprine 10 MG tablet   Commonly known as: FLEXERIL   Take 1 tablet (10 mg total) by mouth 3 (three) times daily. MAX 3 pills daily      fexofenadine 180 MG tablet   Commonly known as: ALLEGRA   Take 180 mg by mouth daily.      FIBER PO   Take 2 tablets by mouth daily.      ondansetron 4 MG tablet   Commonly known as: ZOFRAN   Take 1 tablet (4 mg total) by mouth every 8 (eight) hours as needed for nausea. MAX 3 pills daily      polyethylene  glycol packet   Commonly known as: MIRALAX / GLYCOLAX   Take 17 g by mouth daily. Take 1 packet daily until bowels become regular      pregabalin 75 MG capsule   Commonly known as: LYRICA   Take 75 mg by mouth 2 (two) times daily.      simvastatin 10 MG tablet   Commonly known as: ZOCOR   Take 20 mg by mouth at bedtime.      traMADol 50 MG tablet   Commonly known as: ULTRAM   Take 1 tablet (50 mg total) by mouth 6 (six) times daily.            Diagnostic Studies: Dg Chest 2 View  06/07/2012  *RADIOLOGY REPORT*  Clinical Data: Preoperative radiograph, cervical spine surgery.  CHEST - 2 VIEW  Comparison: 01/18/2012  Findings: Heart  size upper normal to mildly enlarged.  Mild aortic tortuosity is unchanged.  No focal consolidation.  No pleural effusion or pneumothorax.  No acute osseous finding.  IMPRESSION: No radiographic evidence of acute cardiopulmonary process.  Original Report Authenticated By: Waneta Martins, M.D.   Dg Cervical Spine 2-3 Views  06/07/2012  *RADIOLOGY REPORT*  Clinical Data: Status post fusion  CERVICAL SPINE - 2-3 VIEW  Comparison: May 31, 2012  Findings: There is anterior fusion of C4-C6 with normal AP and lateral alignment.  There is no peri hardware lucency.  The disc spaces are preserved. No evidence of fracture.  The prevertebral soft tissue stripe is within normal limits, postoperatively.  IMPRESSION: Postop C4-C6 anterior fusion with normal alignment.  Original Report Authenticated By: Brandon Melnick, M.D.   Dg Cervical Spine 2-3 Views  06/07/2012  *RADIOLOGY REPORT*  Clinical Data: Anterior discectomy and fusion.  DG C-ARM 61-120 MIN, CERVICAL SPINE - 2-3 VIEW  Technique: Two fluoroscopic intraoperative spot views of the cervical spine are provided.  Comparison:  Plain films cervical spine 05/31/2012.  Findings: Provided images demonstrate anterior plate and screws with interbody spacers at C4-6.  IMPRESSION: C4-6 ACDF.  Original Report Authenticated By: Bernadene Bell. D'ALESSIO, M.D.   Dg Cervical Spine 2-3 Views  05/31/2012  *RADIOLOGY REPORT*  Clinical Data: Preop for ACDF on 06/07/2012.  CERVICAL SPINE - 2-3 VIEW  Comparison: None.  Findings: z There is loss of cervical lordosis.  This may be secondary to splinting, soft tissue injury, or positioning. Degenerative changes are identified at C5-6, C6-7, and C7-T1. These changes are felt to account for the loss of lordosis.  There is mild anterolisthesis of C4 on C5, measured to be approximately 2 mm. IMPRESSION:  1.  Degenerative changes as described. 2. Anterolisthesis of C4 on C5, likely degenerative.  Original Report Authenticated By: Patterson Hammersmith, M.D.   Dg C-arm [id number] Min  06/07/2012  *RADIOLOGY REPORT*  Clinical Data: Anterior discectomy and fusion.  DG C-ARM 61-120 MIN, CERVICAL SPINE - 2-3 VIEW  Technique: Two fluoroscopic intraoperative spot views of the cervical spine are provided.  Comparison:  Plain films cervical spine 05/31/2012.  Findings: Provided images demonstrate anterior plate and screws with interbody spacers at C4-6.  IMPRESSION: C4-6 ACDF.  Original Report Authenticated By: Bernadene Bell. Maricela Curet, M.D.    Discharge Orders    Future Orders Please Complete By Expires   Diet - low sodium heart healthy      Call MD / Call 911      Comments:   If you experience chest pain or shortness of breath, CALL 911 and be transported to  the hospital emergency room.  If you develope a fever above 101 F, pus (white drainage) or increased drainage or redness at the wound, or calf pain, call your surgeon's office.   Constipation Prevention      Comments:   Drink plenty of fluids.  Prune juice may be helpful.  You may use a stool softener, such as Colace (over the counter) 100 mg twice a day.  Use MiraLax (over the counter) for constipation as needed.   Increase activity slowly as tolerated      Discharge instructions      Comments:   Keep incision clean and dry.  Leave steri strips in place.  May shower 5 days from surgery; pat to dry following shower.  May redress with clean, dry dressing if you would like.  Do not apply any lotion/cream/ointment to the incision.   Driving restrictions      Comments:   No driving for 2 weeks.  Dr Shon Baton will discuss addition driving restrictions at your first post-op visit in 2 weeks.   Lifting restrictions      Comments:   No lifting anything greater than 5 pounds.  DO NOT reach overhead (above shoulder height).  NO bending, stooping or squatting.  Dr. Shon Baton will discuss additional lifting restrictions at your first post-op visit in 2 weeks.      Follow-up Information    Follow up with  Alvy Beal, MD in 2 weeks.   Contact information:   [facility] [address] [place] [address] [phone]          Discharge Plan:  discharge to Home   Disposition: stable at the time of discharge     Signed: Gwinda Maine for Dr. Venita Lick [facility] [phone] 06/08/2012, 7:43 AM

## 2012-06-08 NOTE — Progress Notes (Signed)
    Subjective: Procedure(s) (LRB): ANTERIOR CERVICAL DECOMPRESSION/DISCECTOMY FUSION 2 LEVELS (N/A) 1 Day Post-Op  Patient reports pain as 3 on 0-10 scale.  Reports decreased arm pain denies incisional neck pain   Positive void Negative bowel movement Positive flatus Negative chest pain or shortness of breath  Objective: Vital signs in last 24 hours: Temp:  [97.2 F (36.2 C)-98.4 F (36.9 C)] 97.2 F (36.2 C) (06/21 0521) Pulse Rate:  [60-88] 74  (06/21 0521) Resp:  [12-21] 18  (06/21 0521) BP: (106-123)/(50-72) 107/57 mmHg (06/21 0521) SpO2:  [92 %-100 %] 98 % (06/21 0521) Weight:  [61.689 kg (136 lb)] 61.689 kg (136 lb) (06/20 1205)  Intake/Output from previous day: 06/20 0701 - 06/21 0700 In: 2110 [I.V.:1800; IV Piggyback:310] Out: -   Labs: No results found for this basename: WBC:2,RBC:2,HCT:2,PLT:2 in the last 72 hours No results found for this basename: NA:2,K:2,CL:2,CO2:2,BUN:2,CREATININE:2,GLUCOSE:2,CALCIUM:2 in the last 72 hours No results found for this basename: LABPT:2,INR:2 in the last 72 hours  Physical Exam: Neurologically intact Neurovascular intact Sensation intact distally Intact pulses distally Incision: dressing C/D/I and no drainage Compartment soft  Assessment/Plan: Patient stable  xrays satisfactory Mobilization with physical therapy Encourage incentive spirometry Continue care  D/C to home today  Venita Lick, MD [facility] [phone]

## 2012-06-08 NOTE — Evaluation (Signed)
Occupational Therapy Evaluation and Discharge Patient Details Name: ARTHUR AYDELOTTE MRN: [id number] DOB: 1956-07-30 Today's Date: 06/08/2012 Time: 5621-3086 OT Time Calculation (min): 20 min  OT Assessment / Plan / Recommendation Clinical Impression  patient is 56 y/o female admitted with ACDF presenting to acute OT with all education completed. No further OT needs; will sign off.    OT Assessment  Patient does not need any further OT services    Follow Up Recommendations  No OT follow up       Equipment Recommendations  None recommended by OT          Precautions / Restrictions Precautions Precautions: Cervical Required Braces or Orthoses: Cervical Brace Cervical Brace: Hard collar;Applied in sitting position Restrictions Weight Bearing Restrictions: No   Pertinent Vitals/Pain No complaints of pain.    ADL  Eating/Feeding: Simulated;Modified independent Where Assessed - Eating/Feeding: Chair Grooming: Performed;Teeth care;Independent Where Assessed - Grooming: Unsupported standing Upper Body Bathing: Simulated;Supervision/safety Where Assessed - Upper Body Bathing: Unsupported standing Lower Body Bathing: Simulated;Supervision/safety Where Assessed - Lower Body Bathing: Unsupported sit to stand Upper Body Dressing: Simulated;Supervision/safety Where Assessed - Upper Body Dressing: Unsupported standing Lower Body Dressing: Simulated;Supervision/safety Where Assessed - Lower Body Dressing: Unsupported sit to stand Toilet Transfer: Performed;Supervision/safety Toilet Transfer Method: Sit to Barista: Raised toilet seat with arms (or 3-in-1 over toilet) Toileting - Clothing Manipulation and Hygiene: Performed;Independent Where Assessed - Toileting Clothing Manipulation and Hygiene: Standing Transfers/Ambulation Related to ADLs: Patient performed transfers at supervision level. ADL Comments: Patient educated on proper donning/doffing of cervical  collar, cleaning procedure, assembly of parts and maintenance. patient was educated on cervical safety precautions related to BADL and IADL. Patient verbalized understanding.      OT Goals    Visit Information  Last OT Received On: 06/08/12 Assistance Needed: +1    Subjective Data  Subjective: "I was just about to brush my teeth." Patient Stated Goal: To return to work as soon as possible.   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 for tasks assessed Left Lower Extremity Assessment LLE ROM/Strength/Tone: [facility] for tasks assessed Trunk Assessment Trunk Assessment: Normal   Mobility Bed Mobility Bed Mobility: Rolling Left;Sit to Supine;Supine to Sit Rolling Left: 5: Supervision Supine to Sit: 5: Supervision Sit to Supine: 5: Supervision Transfers Transfers: Sit to Stand;Stand to Sit Sit to Stand: 5: Supervision Stand to Sit: 5: Supervision         End of Session OT - End of Session Equipment  Utilized During Treatment: Cervical collar Activity Tolerance: Patient tolerated treatment well Patient left: in chair;with call bell/phone within reach;with family/visitor present   Kourosh Jablonsky, Charisse March, OTR/L 06/08/2012, 10:06 AM [phone]

## 2012-06-08 NOTE — Discharge Instructions (Signed)

## 2012-06-08 NOTE — Progress Notes (Signed)
Physical Therapy Evaluation Note  Past Medical History  Diagnosis Date  . Hyperlipidemia     takes Simvastatin nightly  . Pneumonia 2012  . Bronchitis 2012  . Hx of seasonal allergies     takes Allegra daily  . Headache     occasionally  . Dizziness     r/t menstrual cycle;was given Meclizine and no problems in over a yr  . Arthritis   . Joint pain   . Joint swelling   . Back pain     DDD  . Constipation   . Anxiety     but doesn't require any meds  . Insomnia     d/t neck pain and menopause   Past Surgical History  Procedure Date  . Tonsillectomy     at age 56  . Carpal tunnel release     bilateral  . 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 Colonoscopy     06/08/12 0805  PT Visit Information  Last PT Received On 06/08/12  Assistance Needed +1  PT Time Calculation  PT Start Time 0805  PT Stop Time 0830  PT Time Calculation (min) 25 min  Subjective Data  Subjective Pt received supine in bed with report of minimal cervical pain. Patient also reports she is going home today.  Precautions  Precautions Cervical  Precaution Comments handout provided  Required Braces or Orthoses Cervical Brace  Cervical Brace Hard collar  Restrictions  Weight Bearing Restrictions No  Home Living  Lives With Spouse  Available Help at Discharge Available 24 hours/day  Type of Home House  Home Access Stairs to enter  Entrance Stairs-Number of Steps 5  Entrance Stairs-Rails Left  Home Layout Able to live on main level with bedroom/bathroom  Public affairs consultant None  Prior Function  Level of Independence Independent  Able to Take Stairs? 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 for tasks performed  Right Upper Extremity Assessment  RUE ROM/Strength/Tone WFL  Left Upper Extremity Assessment  LUE ROM/Strength/Tone WFL for tasks assessed  Right Lower Extremity Assessment  RLE ROM/Strength/Tone WFL  Left Lower Extremity Assessment  LLE ROM/Strength/Tone WFL  Trunk Assessment  Trunk Assessment Normal  Bed Mobility  Bed Mobility Rolling Right;Right Sidelying to Sit  Rolling Right 5: Supervision  Right Sidelying to Sit 5: Supervision  Details for Bed Mobility Assistance v/c's for hand placement  Transfers  Transfers Sit to Stand;Stand to Sit  Sit to Stand 5: Supervision  Stand to Sit 5: Supervision  Details for Transfer Assistance pt demo'd good technique  Ambulation/Gait  Ambulation/Gait Assistance 5: Supervision  Ambulation Distance (Feet) 200 Feet  Assistive device None  Ambulation/Gait Assistance Details guarded posture, reciprocal gait pattern, cautious  Gait Pattern Step-through pattern;Decreased stride length  Stairs Yes  Stair Management Technique One rail Right;Forwards  Number of Stairs 4   PT - End of Session  Equipment Utilized During Treatment Gait belt  Activity Tolerance Patient tolerated treatment well  Patient left in chair;with call bell/phone within reach  Nurse Communication Mobility status  PT Assessment  Clinical Impression Statement Pt s/p anterior cervical decompression mobilizing  well. patient safe to d/c home today as anticpated by MD. patient good comprehension of cervical precautions and how to don/doff collar in sitting. Patient aware of when she needs to wear the collar (when up OOB). Per Lafonda Mosses, [place] patient doesn't need to wear collar in bed, bathing, and eating. PT signing off on pt. Please re-consult if skilled PT needed in future.  PT Recommendation/Assessment Patent does not need any further PT  services  No Skilled PT All education completed;Patient is supervision for all activity/mobility  PT Recommendation  Follow Up Recommendations No PT follow up  Equipment Recommended None recommended by PT  PT General Charges  $$ ACUTE PT VISIT 1 Procedure  PT Evaluation  $Initial PT Evaluation Tier II 1 Procedure  Written Expression  Dominant Hand Right     Pain: patient reports minimal pain at surgical site  Lewis Shock, PT, DPT Pager #: [phone] Office #: [phone]

## 2012-06-08 NOTE — Progress Notes (Signed)
Clinical Social Work  CSW received referral for ?SNF. CSW reviewed chart and observed that PT recommended no PT follow up for patient. CSW is signing off but available if needed.  [place], [place] [phone] (Coverage for Lovette Cliche)

## 2012-09-06 ENCOUNTER — Other Ambulatory Visit: Payer: Self-pay | Admitting: Internal Medicine

## 2012-09-06 DIAGNOSIS — M8580 Other specified disorders of bone density and structure, unspecified site: Secondary | ICD-10-CM

## 2012-09-06 DIAGNOSIS — Z780 Asymptomatic menopausal state: Secondary | ICD-10-CM

## 2012-09-14 ENCOUNTER — Ambulatory Visit
Admission: RE | Admit: 2012-09-14 | Discharge: 2012-09-14 | Disposition: A | Discharge status: Home / Self Care | Payer: BC Managed Care – PPO | Source: Ambulatory Visit | Attending: Internal Medicine | Admitting: Internal Medicine

## 2012-09-14 DIAGNOSIS — Z780 Asymptomatic menopausal state: Secondary | ICD-10-CM

## 2012-09-24 ENCOUNTER — Ambulatory Visit
Admission: RE | Admit: 2012-09-24 | Discharge: 2012-09-24 | Disposition: A | Discharge status: Home / Self Care | Payer: BC Managed Care – PPO | Source: Ambulatory Visit | Attending: Internal Medicine | Admitting: Internal Medicine

## 2012-09-25 ENCOUNTER — Other Ambulatory Visit: Payer: BC Managed Care – PPO

## 2014-01-21 ENCOUNTER — Ambulatory Visit
Admission: RE | Admit: 2014-01-21 | Discharge: 2014-01-21 | Disposition: A | Discharge status: Home / Self Care | Payer: BC Managed Care – PPO | Source: Ambulatory Visit | Attending: Family Medicine | Admitting: Family Medicine

## 2014-01-21 ENCOUNTER — Other Ambulatory Visit: Payer: Self-pay | Admitting: Family Medicine

## 2014-01-21 DIAGNOSIS — R52 Pain, unspecified: Secondary | ICD-10-CM

## 2014-04-08 ENCOUNTER — Encounter: Payer: Self-pay | Admitting: Podiatrist

## 2014-04-08 ENCOUNTER — Ambulatory Visit (INDEPENDENT_AMBULATORY_CARE_PROVIDER_SITE_OTHER): Payer: BC Managed Care – PPO | Admitting: Podiatrist

## 2014-04-08 VITALS — BP 115/83 | HR 82 | Resp 18

## 2014-04-08 DIAGNOSIS — Z79899 Other long term (current) drug therapy: Secondary | ICD-10-CM

## 2014-04-08 DIAGNOSIS — B351 Tinea unguium: Secondary | ICD-10-CM

## 2014-04-08 MED ORDER — TERBINAFINE HCL 250 MG PO TABS: 250.0000 mg | ORAL_TABLET | Freq: Every day | ORAL | Status: DC

## 2014-04-08 NOTE — Progress Notes (Signed)
   Subjective:    Patient ID: Morgan Johnston, female    DOB: 19-Apr-1956, 58 y.o.   MRN: [id number]  HPI both my big toenails are sore and tender and discolored and I do go get pedicures and they dig them out and been going on for about 9 months    Review of Systems  Musculoskeletal: Positive for back pain.  Allergic/Immunologic: Positive for environmental allergies.  All other systems reviewed and are negative.      Objective:   Physical Exam  Neurovascular status is intact with palpable pedal pulses at 2/4 DP and PT bilateral and capillary refill time within normal limits. Neurological sensation is also intact bilaterally both upper critically and protectively. Musculoskeletal examination reveals good muscle, strength, tone and stability. Musculature intact with dorsiflexion, plantarflexion, inversion, eversion. Medial nail border of the right hallux is mildly ingrown- yellowish brownish discoloration of the corner is also noted.  Left 2nd toenail has thick and whitish appearance beneath the toenail consistent with mycotic infection  Assessment & Plan:  Mycotic toenail, ingrown medial nail border right hallux Plan: Discussed treatment options and alternatives. Recommended Lamisil for the fungal infection. I believe that getting the fungal infection cleared will help ingrown toenail. If she has recurring pain on the toenail we will do a permanent phenol matrixectomy. Am sending her out for liver function test and I gave her the prescription for Lamisil. I will Call with the result of the liver function test.

## 2014-04-08 NOTE — Patient Instructions (Signed)
Onychomycosis/Fungal Toenails  WHAT IS IT? An infection that lies within the keratin of your nail plate that is caused by a fungus.  WHY ME? Fungal infections affect all ages, sexes, races, and creeds.  There may be many factors that predispose you to a fungal infection such as age, coexisting medical conditions such as diabetes, or an autoimmune disease; stress, medications, fatigue, genetics, etc.  Bottom line: fungus thrives in a warm, moist environment and your shoes offer such a location.  IS IT CONTAGIOUS? Theoretically, yes.  You do not want to share shoes, nail clippers or files with someone who has fungal toenails.  Walking around barefoot in the same room or sleeping in the same bed is unlikely to transfer the organism.  It is important to realize, however, that fungus can spread easily from one nail to the next on the same foot.  HOW DO WE TREAT THIS?  There are several ways to treat this condition.  Treatment may depend on many factors such as age, medications, pregnancy, liver and kidney conditions, etc.  It is best to ask your doctor which options are available to you.  1. No treatment.   Unlike many other medical concerns, you can live with this condition.  However for many people this can be a painful condition and may lead to ingrown toenails or a bacterial infection.  It is recommended that you keep the nails cut short to help reduce the amount of fungal nail. 2. Topical treatment.  These range from herbal remedies to prescription strength nail lacquers.  About 40-50% effective, topicals require twice daily application for approximately 9 to 12 months or until an entirely new nail has grown out.  The most effective topicals are medical grade medications available through physicians offices. 3. Oral antifungal medications.  With an 85-90% cure rate, the most common oral medication requires 3 to 4 months of therapy and stays in your system for a year as the new nail grows out.  Oral  antifungal medications do require blood work to make sure it is a safe drug for you.  A liver function panel will be performed prior to starting the medication and after the first month of treatment.  It is important to have the blood work performed to avoid any harmful side effects.  In general, this medication safe but blood work is required. 4. Laser Therapy.  This treatment is performed by applying a specialized laser to the affected nail plate.  This therapy is noninvasive, fast, and non-painful.  It is not covered by insurance and is therefore, out of pocket.   [facility] is the only practice in the area to offer this therapy. 5. Permanent Nail Avulsion.  Removing part of the nail so that a new nail will not grow back.

## 2014-06-12 ENCOUNTER — Encounter: Payer: Self-pay | Admitting: Gastroenterology

## 2014-06-27 ENCOUNTER — Telehealth: Payer: Self-pay | Admitting: Gastroenterology

## 2014-06-27 DIAGNOSIS — Z1211 Encounter for screening for malignant neoplasm of colon: Secondary | ICD-10-CM

## 2014-06-27 NOTE — Telephone Encounter (Signed)
Even thpugh she has a redundant colon sometimes we're successful on another exam.  Would proceed directly to colonoscopy which is still a better test at detecting polyps.

## 2014-06-27 NOTE — Telephone Encounter (Signed)
Pt aware. Pt scheduled for previsit 08/05/14@8am , Pt scheduled for Colon in the [facility] 08/11/14@4pm . Pt aware of appt.

## 2014-06-27 NOTE — Telephone Encounter (Signed)
Pt states she had an incomplete colon and had to have a barium enema done. Pt states the radiologist mentioned that she would be a candidate for a virtual colon. Please advise.

## 2014-06-30 ENCOUNTER — Encounter: Payer: Self-pay | Admitting: Gastroenterology

## 2014-07-03 ENCOUNTER — Encounter: Payer: Self-pay | Admitting: Podiatrist

## 2014-07-23 ENCOUNTER — Telehealth: Payer: Self-pay | Admitting: Gastroenterology

## 2014-07-24 NOTE — Telephone Encounter (Signed)
Left message for pt to call back.  07/28/14 Left message for pt to call back.

## 2014-07-29 NOTE — Telephone Encounter (Signed)
Called and left message for pt to call back. After multiple attempts have been unable to reach pt.

## 2014-07-31 ENCOUNTER — Telehealth: Payer: Self-pay

## 2014-07-31 NOTE — Telephone Encounter (Signed)
Pt called back and states she has had incomplete colons in the past and that she cannot and will not go through another barium enema. Pt states she would like to have a virtual colon due to the odds of her colon being incomplete again. She realizes that if a polyp is found she would have to then have a colon to have polyp removed. Dr. Deatra Ina please advise.

## 2014-08-01 NOTE — Telephone Encounter (Signed)
Ok to schedule virtual colonoscopy

## 2014-08-04 ENCOUNTER — Other Ambulatory Visit: Payer: Self-pay

## 2014-08-04 DIAGNOSIS — Z1211 Encounter for screening for malignant neoplasm of colon: Secondary | ICD-10-CM

## 2014-08-04 NOTE — Telephone Encounter (Signed)
Pt scheduled for virtual colon 08/11/14@7 :45am. Pt to pick up prep 3 days in advance at test location [address] Pt aware of appt.

## 2014-08-11 ENCOUNTER — Encounter: Payer: BC Managed Care – PPO | Admitting: Gastroenterology

## 2014-08-11 ENCOUNTER — Ambulatory Visit
Admission: RE | Admit: 2014-08-11 | Discharge: 2014-08-11 | Disposition: A | Discharge status: Home / Self Care | Payer: BC Managed Care – PPO | Source: Ambulatory Visit | Attending: Gastroenterology | Admitting: Gastroenterology

## 2014-08-11 DIAGNOSIS — Z1211 Encounter for screening for malignant neoplasm of colon: Secondary | ICD-10-CM

## 2014-08-13 ENCOUNTER — Telehealth: Payer: Self-pay | Admitting: Gastroenterology

## 2014-08-13 NOTE — Telephone Encounter (Signed)
Spoke with patient. Noted on chart to speak with her. Best to reach her on her cell phone.

## 2014-08-14 ENCOUNTER — Telehealth: Payer: Self-pay

## 2014-08-14 NOTE — Telephone Encounter (Signed)
Message copied by Greggory Keen on Thu Aug 14, 2014 10:09 AM ------      Message from: Erskine Emery D      Created: Wed Aug 13, 2014  4:47 PM       She will need a followup virtual colonoscopy in 5 years because of family history ------

## 2014-08-14 NOTE — Telephone Encounter (Signed)
Patient contacted and advised. Recall entered into the system.

## 2014-08-29 ENCOUNTER — Encounter: Payer: Self-pay | Admitting: *Deleted

## 2014-10-13 ENCOUNTER — Telehealth: Payer: Self-pay | Admitting: Gastroenterology

## 2014-10-13 NOTE — Telephone Encounter (Signed)
Patient has not been seen in the office for at least 5 years.  Last interaction was a colonoscopy 2010.  She needs an office visit.

## 2014-10-13 NOTE — Telephone Encounter (Signed)
Spoke with the patient. She reports she has had issues with constipation for years, but things have worsened. She is now having spells of constipation (no BM for 3 days) then she has terrible stomach cramps. Finally she will have diarrhea. She has had 3 bms today today. This happens about twice a week. No blood with bowel movements. She takes a "gummy" fiber supplement, eats live culture yogurt daily, eats as healthy as possible and drinks lots of water. Please advise.

## 2014-10-16 ENCOUNTER — Encounter: Payer: Self-pay | Admitting: Gastroenterology

## 2014-10-16 ENCOUNTER — Ambulatory Visit (INDEPENDENT_AMBULATORY_CARE_PROVIDER_SITE_OTHER): Payer: BC Managed Care – PPO | Admitting: Gastroenterology

## 2014-10-16 ENCOUNTER — Other Ambulatory Visit (INDEPENDENT_AMBULATORY_CARE_PROVIDER_SITE_OTHER): Payer: BC Managed Care – PPO

## 2014-10-16 VITALS — BP 108/74 | HR 68 | Ht 59.75 in | Wt 140.5 lb

## 2014-10-16 DIAGNOSIS — R140 Abdominal distension (gaseous): Secondary | ICD-10-CM

## 2014-10-16 DIAGNOSIS — Q438 Other specified congenital malformations of intestine: Secondary | ICD-10-CM

## 2014-10-16 DIAGNOSIS — K5900 Constipation, unspecified: Secondary | ICD-10-CM

## 2014-10-16 DIAGNOSIS — Q434 Duplication of intestine: Secondary | ICD-10-CM

## 2014-10-16 LAB — IGA: IgA: 48 mg/dL — ABNORMAL LOW (ref 68–378)

## 2014-10-16 MED ORDER — HYOSCYAMINE SULFATE 0.125 MG SL SUBL: 0.1250 mg | SUBLINGUAL_TABLET | SUBLINGUAL | Status: DC | PRN

## 2014-10-16 NOTE — Progress Notes (Signed)
10/16/2014 Morgan Johnston [id number] Dec 27, 1955   HISTORY OF PRESENT ILLNESS:  This is a pleasant 58 year old female who is known to Dr. Deatra Ina.  She's had failed colonoscopies in the past due to redundant and tortuous colon.  She has a sister who had colon cancer.  She gets virtual colonoscopies for surveillance.  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    She presents to our office today with complaints of constipation.  She says that she gets constipated for several days then will eventually have abdominal cramping with gas/bloating and then diarrhea, which resolves everything until the cycle starts again.  She takes fiber gummy supplements daily but no other laxatives, etc.  Constipation has seemed to worsen recently.  No complaints at this time, but her lat BM was 3 days ago.   Past Medical History  Diagnosis Date  . Hyperlipidemia     takes Simvastatin nightly  . Pneumonia 2012  . Bronchitis 2012  . Hx of seasonal allergies     takes Allegra daily  . Headache(784.0)     occasionally  . Dizziness     r/t menstrual cycle;was given Meclizine and no problems in over a yr  . Arthritis   . Joint pain   . Joint swelling   . Back pain     DDD  . Constipation   . Anxiety     but doesn't require any meds  . Insomnia     d/t neck pain and menopause   Past Surgical History  Procedure Laterality Date  . Tonsillectomy      at age 74  . Carpal tunnel release      bilateral  . 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 Colonoscopy    . Anterior cervical decomp/discectomy fusion  06/07/2012    Procedure: ANTERIOR CERVICAL DECOMPRESSION/DISCECTOMY FUSION 2 LEVELS;  Surgeon: Melina Schools, MD;  Location: [facility];  Service: Orthopedics;  Laterality: N/A;  ACDF C4-5, 5-6    reports that she has never smoked.  She has never used smokeless tobacco. She reports that she does not drink alcohol or use illicit drugs. family history includes Colon cancer in her sister; Colon polyps in her father; Diabetes in her brother, father, and mother; Kidney disease (age of onset: 70) in her father. There is no history of Esophageal cancer. Allergies  Allergen Reactions  . Augmentin [Amoxicillin-Pot Clavulanate] Anaphylaxis    Pt gets a pre-anaphylaxis reaction  . Amoxicillin-Pot Clavulanate     REACTION: itching  . Codeine Other (See Comments)    REACTION: dizziness/nausea  . Hydrocodone   . Oxycodone       Outpatient Encounter Prescriptions as of 10/16/2014  Medication Sig  . Calcium Carbonate (CALCIUM 600 PO) Take 1,200 mg by mouth daily.   . fexofenadine (ALLEGRA) 180 MG tablet Take 180 mg by mouth daily.  Marland Kitchen FIBER PO Take 2 tablets by mouth daily.  Marland Kitchen ibandronate (BONIVA) 150 MG tablet   . MIMVEY 1-0.5 MG per tablet   . omeprazole (PRILOSEC) 20 MG capsule Take 20 mg by mouth daily.  . pregabalin (LYRICA) 75 MG capsule Take 75 mg by mouth 2 (two) times daily.  . simvastatin (ZOCOR) 10 MG tablet Take 20 mg by mouth at bedtime.   . hyoscyamine (LEVSIN SL) 0.125  MG SL tablet Place 1 tablet (0.125 mg total) under the tongue every 4 (four) hours as needed.  . [DISCONTINUED] terbinafine (LAMISIL) 250 MG tablet Take 1 tablet (250 mg total) by mouth daily.     REVIEW OF SYSTEMS  : All other systems reviewed and negative except where noted in the History of Present Illness.   PHYSICAL EXAM: BP 108/74  Pulse 68  Ht 4' 11.75" (1.518 m)  Wt 140 lb 8 oz (63.73 kg)  BMI 27.66 kg/m2 General: Well developed white female in no acute distress Head: Normocephalic and atraumatic Eyes:  Sclerae anicteric, conjunctiva pink. Ears: Normal auditory acuity Lungs: Clear throughout to auscultation Heart: Regular rate and rhythm Abdomen: Soft, non-distended.  Normal bowel sounds.  Non-tender. Musculoskeletal:  Symmetrical with no gross deformities  Skin: No lesions on visible extremities Extremities: No edema  Neurological: Alert oriented x 4, grossly non-focal Psychological:  Alert and cooperative. Normal mood and affect  ASSESSMENT AND PLAN: -Constipation:  Likely has some IBS with predominant constipation alternating with occasional diarrhea.  Also has a redundant and tortuous colon, which likely contributes to her constipation as well.  Also with some cramping and gas/bloating as well.  Will have her start daily Miralax with her daily fiber supplement that she is already taking.  Can try levsin prn for cramping.  Will check celiac labs at her request to rule that out as a cause of her symptoms.  Follow-up in 6-8 weeks.

## 2014-10-16 NOTE — Patient Instructions (Signed)
You are scheduled for a follow up visit with Dr. Deatra Ina for 12-22-2014 at 9:15 am If symptoms are not better or become worse please call office back to schedule an appointment with one of Dr. Kelby Fam extenders  Your physician has requested that you go to the basement for the following lab work before leaving today: TTG IGA  We have sent the following medications to your pharmacy for you to pick up at your convenience: Levsin, can take as needed   Please purchase Miralax over the counter and mix one capful in eight ounces of juice or water, drink once daily   Continue Fiber

## 2014-10-17 LAB — TISSUE TRANSGLUTAMINASE, IGA: Tissue Transglutaminase Ab, IgA: 1.6 U/mL (ref <20)

## 2014-10-17 NOTE — Progress Notes (Signed)
Reviewed and agree with management.  If medications are ineffective may try Linzess Robert D. Deatra Ina, M.D., [facility]

## 2014-12-22 ENCOUNTER — Ambulatory Visit: Payer: BC Managed Care – PPO | Admitting: Gastroenterology

## 2014-12-26 ENCOUNTER — Encounter: Payer: Self-pay | Admitting: Gastroenterology

## 2015-01-25 NOTE — Progress Notes (Signed)
Cardiology Office Note   Date:  01/26/2015   ID:  Morgan Johnston, DOB 08/20/1956, MRN [id number]  PCP:  Gerrit Heck, MD  Cardiologist:   Sueanne Margarita, MD   Chief Complaint  Patient presents with  . Chest Pain      History of Present Illness: Morgan Johnston is a 59 y.o. female who presents for evaluation of chest pain.  The chest pain has been off and on for years and has had several stress tests in the past.  She recently saw her PCP and was complaining of occasional chest pain.  This has gotten more intense in the past 8 weeks.  This is associated with nausea but improved with belching.  It can occur at any time of the day. The nausea and belching can occur without the CP and sometimes she has no symptoms with the CP.  She describes it as a sharp pain that radiated one time into her arm but she has not had radiation at any other time.  It is not exertional and she denies any SOB.  She has a family history of CAD with her brother having an MI in his 2's.  Her other CRF include dyslipidemia.  She denies and palpitations, dizziness, LE edema or syncope.    Past Medical History  Diagnosis Date  . Hyperlipidemia     takes Simvastatin nightly  . Pneumonia 2012  . Bronchitis 2012  . Hx of seasonal allergies     takes Allegra daily  . Headache(784.0)     occasionally  . Dizziness     r/t menstrual cycle;was given Meclizine and no problems in over a yr  . Arthritis   . Joint pain   . Joint swelling   . Back pain     DDD  . Constipation   . Anxiety     but doesn't require any meds  . Insomnia     d/t neck pain and menopause    Past Surgical History  Procedure Laterality Date  . Tonsillectomy      at age 18  . Carpal tunnel release      bilateral  . 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 Colonoscopy    . Anterior cervical decomp/discectomy fusion  06/07/2012    Procedure: ANTERIOR CERVICAL  DECOMPRESSION/DISCECTOMY FUSION 2 LEVELS;  Surgeon: Melina Schools, MD;  Location: [facility];  Service: Orthopedics;  Laterality: N/A;  ACDF C4-5, 5-6     Current Outpatient Prescriptions  Medication Sig Dispense Refill  . Calcium Carbonate (CALCIUM 600 PO) Take 1,200 mg by mouth daily.     Marland Kitchen FIBER PO Take 2 tablets by mouth daily.    . hyoscyamine (LEVSIN SL) 0.125 MG SL tablet Place 1 tablet (0.125 mg total) under the tongue every 4 (four) hours as needed. 30 tablet 0  . ibandronate (BONIVA) 150 MG tablet Take 150 mg by mouth every 30 (thirty) days.     Marland Kitchen MIMVEY 1-0.5 MG per tablet Take 1 tablet by mouth daily.     Marland Kitchen omeprazole (PRILOSEC) 20 MG capsule Take 20 mg by mouth daily.    . pregabalin (LYRICA) 75 MG capsule Take 75 mg by mouth 2 (two) times daily.    . simvastatin (ZOCOR) 10 MG tablet Take 20 mg by mouth at bedtime.      No current facility-administered medications for this visit.    Allergies:  Augmentin; Amoxicillin-pot clavulanate; Codeine; Hydrocodone; and Oxycodone    Social History:  The patient  reports that she has never smoked. She has never used smokeless tobacco. She reports that she does not drink alcohol or use illicit drugs.   Family History:  The patient's family history includes Colon cancer in her sister; Colon polyps in her father; Diabetes in her brother, father, and mother; Kidney disease (age of onset: 31) in her father. There is no history of Esophageal cancer.    ROS:  Please see the history of present illness.   Otherwise, review of systems are positive for none.   All other systems are reviewed and negative.    PHYSICAL EXAM: VS:  There were no vitals taken for this visit. , BMI There is no weight on file to calculate BMI. GEN: Well nourished, well developed, in no acute distress HEENT: normal Neck: no JVD, carotid bruits, or masses Cardiac: RRR; no murmurs, rubs, or gallops,no edema  Respiratory:  clear to auscultation bilaterally, normal work of  breathing GI: soft, nontender, nondistended, + BS MS: no deformity or atrophy Skin: warm and dry, no rash Neuro:  Strength and sensation are intact Psych: euthymic mood, full affect   EKG:  EKG is ordered today. The ekg ordered today demonstrates NSR with nonspecific T wave abnormality, low voltage QRS   Recent Labs: No results found for requested labs within last 365 days.    Lipid Panel No results found for: CHOL, TRIG, HDL, CHOLHDL, VLDL, LDLCALC, LDLDIRECT    Wt Readings from Last 3 Encounters:  10/16/14 140 lb 8 oz (63.73 kg)       ASSESSMENT AND PLAN:  1.  Atypical Chest pain that is nonexertional and relieved with belching.  She has several cardiac risk factors including dyslipidemia and family history of CAD at an early age.  She does have GERD which triggers asthma.  Will proceed with nuclear stress test to rule out ischemia.  2.  Dyslipidemia 3.  GERD   Current medicines are reviewed at length with the patient today.  The patient does not have concerns regarding medicines.  The following changes have been made:  no change  Labs/ tests ordered today include: Stress myoview     Disposition:   FU with me PRN   Signed, Sueanne Margarita, MD  01/26/2015 8:18 AM    [facility] [place], [place], [place]  [address] Phone: [phone]; Fax: [phone]

## 2015-01-26 ENCOUNTER — Ambulatory Visit (INDEPENDENT_AMBULATORY_CARE_PROVIDER_SITE_OTHER): Payer: BC Managed Care – PPO | Admitting: Cardiology

## 2015-01-26 ENCOUNTER — Encounter: Payer: Self-pay | Admitting: Cardiology

## 2015-01-26 VITALS — BP 102/72 | HR 71 | Ht 59.75 in | Wt 140.0 lb

## 2015-01-26 DIAGNOSIS — E785 Hyperlipidemia, unspecified: Secondary | ICD-10-CM

## 2015-01-26 DIAGNOSIS — R079 Chest pain, unspecified: Secondary | ICD-10-CM

## 2015-01-26 DIAGNOSIS — K219 Gastro-esophageal reflux disease without esophagitis: Secondary | ICD-10-CM

## 2015-01-26 NOTE — Patient Instructions (Signed)
Your physician has requested that you have a exercise myoview. For further information please visit [email or web address]. Please follow instruction sheet, as given.  Your physician recommends that you continue on your current medications as directed. Please refer to the Current Medication list given to you today.  Follow up with Dr. Radford Pax as needed.

## 2015-01-30 ENCOUNTER — Other Ambulatory Visit: Payer: Self-pay

## 2015-01-30 DIAGNOSIS — R079 Chest pain, unspecified: Secondary | ICD-10-CM

## 2015-02-03 ENCOUNTER — Encounter (HOSPITAL_COMMUNITY): Payer: BC Managed Care – PPO

## 2015-02-06 ENCOUNTER — Encounter: Payer: Self-pay | Admitting: Gastroenterology

## 2015-02-06 ENCOUNTER — Ambulatory Visit (INDEPENDENT_AMBULATORY_CARE_PROVIDER_SITE_OTHER): Payer: BC Managed Care – PPO | Admitting: Gastroenterology

## 2015-02-06 VITALS — BP 110/78 | HR 72 | Resp 14 | Ht 59.75 in | Wt 136.8 lb

## 2015-02-06 DIAGNOSIS — Z800 Family history of malignant neoplasm of digestive organs: Secondary | ICD-10-CM

## 2015-02-06 DIAGNOSIS — K5900 Constipation, unspecified: Secondary | ICD-10-CM

## 2015-02-06 NOTE — Patient Instructions (Signed)
Research is speaking with you today Follow up as needed

## 2015-02-06 NOTE — Assessment & Plan Note (Signed)
Patient has IBS/constipation that has responded to a degree on a regimen of MiraLAX and probiotics.  She will consider enrollment in an IBS trial, otherwise will continue current regimen.

## 2015-02-06 NOTE — Assessment & Plan Note (Signed)
Asian has nonspecific chest pain which sounds like chest wall pain.  Doubt cardiac or GI.  She was advised, however, to try an antacid if she has chest discomfort.

## 2015-02-06 NOTE — Progress Notes (Signed)
      History of Present Illness:  Morgan Johnston reports improvement in her symptoms of abdominal distention, discomfort and constipation on a regimen of MiraLAX twice a week and probiotics.  She occasionally has a poorly described aching pain in the left chest that is nonexertional and unrelated to meals.  It is may last minutes to an hour.  She takes omeprazole which has helped a chronic nonproductive cough.    Review of Systems: Pertinent positive and negative review of systems were noted in the above HPI section. All other review of systems were otherwise negative.    Current Medications, Allergies, Past Medical History, Past Surgical History, Family History and Social History were reviewed in [facility] record  Vital signs were reviewed in today's medical record. Physical Exam: General: Well developed , well nourished, no acute distress   See Assessment and Plan under Problem List

## 2015-02-06 NOTE — Assessment & Plan Note (Signed)
Plan surveillance every 5 years.  Patient undergoes virtual colonoscopy because of a history of incomplete colonoscopies in the setting of a severely redundant

## 2015-02-19 ENCOUNTER — Telehealth: Payer: Self-pay | Admitting: *Deleted

## 2015-02-19 NOTE — Telephone Encounter (Signed)
===  View-only below this line===  ----- Message -----    From: Inda Castle, MD    Sent: 02/15/2015   6:55 PM      To: Oda Kilts, CMA Subject: FW: Subject does not qualifiy for IBS-C stud*  Please inform pt to continue her current bowel regimen since she is not eligible for the constipation trial. ----- Message -----    From: [name]    Sent: 02/11/2015  10:41 AM      To: Inda Castle, MD Subject: Subject does not qualifiy for IBS-C study.     This patient has a history of tortuous colon. The medical monitor stated that this would be an exclusion due to the structural abnormality of the colon. Please advise.

## 2015-02-25 ENCOUNTER — Encounter (HOSPITAL_COMMUNITY): Payer: BC Managed Care – PPO

## 2015-02-25 ENCOUNTER — Telehealth (HOSPITAL_COMMUNITY): Payer: Self-pay

## 2015-02-25 NOTE — Telephone Encounter (Signed)
Encounter complete. 

## 2015-02-27 ENCOUNTER — Ambulatory Visit (HOSPITAL_COMMUNITY)
Admission: RE | Admit: 2015-02-27 | Discharge: 2015-02-27 | Disposition: A | Discharge status: Home / Self Care | Payer: BC Managed Care – PPO | Source: Ambulatory Visit | Attending: Internal Medicine | Admitting: Internal Medicine

## 2015-02-27 DIAGNOSIS — R079 Chest pain, unspecified: Secondary | ICD-10-CM | POA: Diagnosis present

## 2015-02-27 NOTE — Procedures (Signed)
Exercise Treadmill Test   Test  Exercise Tolerance Test Ordering MD: Fransico Him, MD    Unique Test No: 1  Treadmill:  1  Indication for ETT: chest pain - rule out ischemia  Contraindication to ETT :  NO   Stress Modality: exercise - treadmill  Cardiac Imaging Performed: non   Protocol: standard Bruce - maximal  Max BP:  147/64  Max MPHR (bpm):  163 85% MPR (bpm):  138  MPHR obtained (bpm):  162 % MPHR obtained:  99  Reached 85% MPHR (min:sec):  9:10 Total Exercise Time (min-sec):  10  Workload in METS:  11.7  Borg Scale: 15  Reason ETT Terminated:  dyspnea    ST Segment Analysis At Rest: normal ST segments - no evidence of significant ST depression With Exercise: significant ischemic ST depression  Other Information Arrhythmia:  No Angina during ETT:  absent (0) Quality of ETT:  diagnostic  ETT Interpretation:  abnormal - evidence of ST depression consistent with ischemia  Comments: 2-3 mm horizontal inferior ST depression at peak exercise Normal BP response to exercise No chest pain Excellent exercise tolerance  Recommendations: Clinical correlation advised - EKG changes may be due to abnormal repolarization  Pixie Casino, MD, [facility] Attending Cardiologist Page

## 2015-03-02 ENCOUNTER — Other Ambulatory Visit: Payer: Self-pay | Admitting: *Deleted

## 2015-03-02 DIAGNOSIS — R079 Chest pain, unspecified: Secondary | ICD-10-CM

## 2015-03-11 ENCOUNTER — Ambulatory Visit (HOSPITAL_COMMUNITY): Payer: BC Managed Care – PPO | Attending: Cardiology | Admitting: Radiology

## 2015-03-11 DIAGNOSIS — R079 Chest pain, unspecified: Secondary | ICD-10-CM

## 2015-03-11 DIAGNOSIS — R9431 Abnormal electrocardiogram [ECG] [EKG]: Secondary | ICD-10-CM | POA: Insufficient documentation

## 2015-03-11 DIAGNOSIS — J45909 Unspecified asthma, uncomplicated: Secondary | ICD-10-CM | POA: Insufficient documentation

## 2015-03-11 MED ORDER — TECHNETIUM TC 99M SESTAMIBI GENERIC - CARDIOLITE
33.0000 | Freq: Once | INTRAVENOUS | Status: AC | PRN
  Administered 2015-03-11: 33 via INTRAVENOUS

## 2015-03-11 MED ORDER — TECHNETIUM TC 99M SESTAMIBI GENERIC - CARDIOLITE
11.0000 | Freq: Once | INTRAVENOUS | Status: AC | PRN
  Administered 2015-03-11: 11 via INTRAVENOUS

## 2015-03-11 NOTE — Progress Notes (Signed)
Morgan Johnston [address] [place], [place] [address] [phone]    Cardiology Nuclear Med Study  Morgan Johnston is a 58 y.o. female     MRN : [id number]     DOB: [birth year]  Procedure Date: 03/11/2015  Nuclear Med Background Indication for Stress Test:  Evaluation for Ischemia and Abnormal EKG with GXT stress test on 02/27/15 History:  MPI 2013 (normal), Asthma Cardiac Risk Factors: Lipids  Symptoms:  Chest Pain (last date of chest discomfort was earlier today)   Nuclear Pre-Procedure Caffeine/Decaff Intake:  None NPO After: 9:00pm   Lungs:  clear O2 Sat: 98% on room air. IV 0.9% NS with Angio Cath:  22g  IV Site: R Wrist  IV Started by:  Matilde Haymaker, RN  Chest Size (in):  38 Cup Size: C  Height: 5' (1.524 m)  Weight:  133 lb (60.328 kg)  BMI:  Body mass index is 25.97 kg/(m^2). Tech Comments:  n/a    Nuclear Med Study 1 or 2 day study: 1 day  Stress Test Type:  Stress  Reading MD: n/a  Order Authorizing Provider:  Traci Turner,MD  Resting Radionuclide: Technetium 32m Sestamibi  Resting Radionuclide Dose: 11.0 mCi   Stress Radionuclide:  Technetium 28m Sestamibi  Stress Radionuclide Dose: 33.0 mCi           Stress Protocol Rest HR: 58 Stress HR: 153  Rest BP: 123/81 Stress BP: 129/79  Exercise Time (min): 10:30 METS: 12.5   Predicted Max HR: 163 bpm % Max HR: 93.87 bpm Rate Pressure Product: 20655   Dose of Adenosine (mg):  n/a Dose of Lexiscan: n/a mg  Dose of Atropine (mg): n/a Dose of Dobutamine: n/a mcg/kg/min (at max HR)  Stress Test Technologist: Glade Lloyd, BS-ES  Nuclear Technologist:  Earl Many, CNMT     Rest Procedure:  Myocardial perfusion imaging was performed at rest 45 minutes following the intravenous administration of Technetium 35m Sestamibi. Rest ECG: NSR - Normal EKG  Stress Procedure:  The patient exercised on the treadmill utilizing the Bruce Protocol for 10:30 minutes. The patient stopped due to  fatigue and 2/10 chest pain that was fleeting and resolved almost immediately from onset.  Technetium 38m Sestamibi was injected at peak exercise and myocardial perfusion imaging was performed after a brief delay. Stress ECG: Nonspecific ST-T wave changes seen at stress.  QPS Raw Data Images:  Normal; no motion artifact; normal heart/lung ratio. Stress Images:  Normal homogeneous uptake in all areas of the myocardium. Rest Images:  Normal homogeneous uptake in all areas of the myocardium. Subtraction (SDS):  No evidence of ischemia. Transient Ischemic Dilatation (Normal <1.22):  0.82 Lung/Heart Ratio (Normal <0.45):  0.40  Quantitative Gated Spect Images QGS EDV:  53 ml QGS ESV:  13 ml  Impression Exercise Capacity:  Good exercise capacity. BP Response:  Blood pressure increased 12 mmHg from rest during stage I however decreased at stage III 10 mmHg from baseline. Clinical Symptoms:  2/10 chest pain, fleeting, resolved at the beginning of exercise. No chest pain during the remainder of study. ECG Impression:  No significant ST segment change suggestive of ischemia. Nonspecific changes noted. Comparison with Prior Nuclear Study: No images to compare  Overall Impression:  Normal stress nuclear study. Low risk study with no evidence of ischemia on perfusion imaging. Blood pressure response was flat throughout exercise. Overall reasonable exercise time however of 10 minutes and 30 seconds.  LV Ejection Fraction: 76%.  LV  Wall Motion:  NL LV Function; NL Wall Motion  Candee Furbish, MD

## 2015-11-02 ENCOUNTER — Ambulatory Visit: Payer: BC Managed Care – PPO | Admitting: Allergy and Immunology

## 2015-12-04 ENCOUNTER — Ambulatory Visit (INDEPENDENT_AMBULATORY_CARE_PROVIDER_SITE_OTHER): Payer: BC Managed Care – PPO | Admitting: Allergy and Immunology

## 2015-12-04 ENCOUNTER — Encounter: Payer: Self-pay | Admitting: Allergy and Immunology

## 2015-12-04 VITALS — BP 98/64 | HR 68 | Resp 18

## 2015-12-04 DIAGNOSIS — K219 Gastro-esophageal reflux disease without esophagitis: Secondary | ICD-10-CM

## 2015-12-04 DIAGNOSIS — J309 Allergic rhinitis, unspecified: Secondary | ICD-10-CM

## 2015-12-04 DIAGNOSIS — H1010 Acute atopic conjunctivitis, unspecified eye: Secondary | ICD-10-CM | POA: Diagnosis not present

## 2015-12-04 DIAGNOSIS — J4520 Mild intermittent asthma, uncomplicated: Secondary | ICD-10-CM

## 2015-12-04 DIAGNOSIS — J387 Other diseases of larynx: Secondary | ICD-10-CM | POA: Diagnosis not present

## 2015-12-04 MED ORDER — OMEPRAZOLE 40 MG PO CPDR: 40.0000 mg | DELAYED_RELEASE_CAPSULE | Freq: Two times a day (BID) | ORAL | Status: DC

## 2015-12-04 MED ORDER — RANITIDINE HCL 300 MG PO TABS: 300.0000 mg | ORAL_TABLET | Freq: Every day | ORAL | Status: DC

## 2015-12-04 NOTE — Patient Instructions (Signed)
  1. Treat reflux:   A. use omeprazole 40 mg twice a day  B. Use ranitidine 300 mg in the evening  C. Consolidate caffeine  and chocolate use  2. Prednisone 10 mg one tablet one time per day for 7 days  3. Can use ProAir HFA and Allegra if needed  4. Return to clinic in 4 weeks or earlier if problem

## 2015-12-04 NOTE — Progress Notes (Signed)
[facility]  Follow-up Note  Refering Provider: Leighton Ruff, MD Primary Provider: Gerrit Heck, MD  Subjective:   Morgan Johnston is a 59 y.o. female who returns to the [facility] in re-evaluation of the following:  HPI Comments:  Morgan Johnston returns to this clinic on 11/30/2015 in reevaluation of cough. I last saw her in this clinic over a year ago and while using omeprazole 20 mg once a day she had very good control of her cough and rarely had to use any ProAir HFA and her nose is doing quite well while using some intermittent antihistamines without any nasal steroids.However, about 2 months ago she started to develop more cough. She increased her omeprazole to 40 g once a day. She's been having problems with heartburn up into her mid chest region. In fact, she had a heart pain evaluation with a thallium stress test which was normal.She does have throat clearing a little bit of itchy spot in her throat especially in the morning. She is seen a gastroenterologist in the past but he apparently has retired.She continues to drink one caffeinated drink per day. She does not have any shortness of breath or chest tightness. She has no ugly nasal discharge or anosmia. She does not have any problems swallowing or sore throat.   Current Outpatient Prescriptions on File Prior to Visit  Medication Sig Dispense Refill  . albuterol (PROAIR HFA) 108 (90 BASE) MCG/ACT inhaler Inhale 2 puffs into the lungs every 4 (four) hours as needed for wheezing or shortness of breath.    . Calcium Carbonate (CALCIUM 600 PO) Take 1,200 mg by mouth daily.     . Cholecalciferol (VITAMIN D) 2000 UNITS CAPS Take 1 capsule by mouth daily.    . fexofenadine (ALLEGRA) 180 MG tablet Take 180 mg by mouth daily.    Marland Kitchen FIBER PO Take 2 tablets by mouth daily.    Marland Kitchen ibandronate (BONIVA) 150 MG tablet Take 150 mg by mouth every 30 (thirty) days.     .  mometasone (NASONEX) 50 MCG/ACT nasal spray Place 2 sprays into the nose daily as needed.    . Probiotic Product (PROBIOTIC DAILY PO) Take by mouth.    . simvastatin (ZOCOR) 20 MG tablet Take 20 mg by mouth every evening.  5   No current facility-administered medications on file prior to visit.    Meds ordered this encounter  Medications  . ranitidine (ZANTAC) 300 MG tablet    Sig: Take 1 tablet (300 mg total) by mouth at bedtime.    Dispense:  30 tablet    Refill:  5  . omeprazole (PRILOSEC) 40 MG capsule    Sig: Take 1 capsule (40 mg total) by mouth 2 (two) times daily.    Dispense:  60 capsule    Refill:  5    Past Medical History  Diagnosis Date  . Hyperlipidemia     takes Simvastatin nightly  . Pneumonia 2012  . Bronchitis 2012  . Hx of seasonal allergies     takes Allegra daily  . Headache(784.0)     occasionally  . Dizziness     r/t menstrual cycle;was given Meclizine and no problems in over a yr  . Arthritis   . Joint pain   . Joint swelling   . Back pain     DDD  . Constipation   . Anxiety     but doesn't require any meds  .  Insomnia     d/t neck pain and menopause    Past Surgical History  Procedure Laterality Date  . Tonsillectomy      at age 84  . Carpal tunnel release      bilateral  . 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 Colonoscopy    . Anterior cervical decomp/discectomy fusion  06/07/2012    Procedure: ANTERIOR CERVICAL DECOMPRESSION/DISCECTOMY FUSION 2 LEVELS;  Surgeon: Melina Schools, MD;  Location: [facility];  Service: Orthopedics;  Laterality: N/A;  ACDF C4-5, 5-6    Allergies  Allergen Reactions  . Augmentin [Amoxicillin-Pot Clavulanate] Anaphylaxis    Pt gets a pre-anaphylaxis reaction  . Codeine Other (See Comments)    REACTION: dizziness/nausea  . Hydrocodone   . Oxycodone     Review of Systems  Constitutional: Negative for fever, chills and fatigue.  HENT:  Negative for congestion, ear pain, facial swelling, hearing loss, nosebleeds, postnasal drip, rhinorrhea, sinus pressure, sneezing, sore throat, tinnitus, trouble swallowing and voice change.   Eyes: Negative for pain, discharge, redness and itching.  Respiratory: Positive for cough. Negative for chest tightness, shortness of breath and wheezing.   Cardiovascular: Negative for chest pain and leg swelling.  Gastrointestinal: Negative for nausea, vomiting and abdominal pain.  Musculoskeletal: Negative for myalgias and arthralgias.  Skin: Negative for rash.  Allergic/Immunologic: Negative.   Neurological: Negative for dizziness and headaches.  Hematological: Negative for adenopathy. Does not bruise/bleed easily.     Objective:   Filed Vitals:   12/04/15 1049  BP: 98/64  Pulse: 68  Resp: 18          Physical Exam  Constitutional: She appears well-developed and well-nourished. No distress.  Slight cough  HENT:  Head: Normocephalic and atraumatic. Head is without right periorbital erythema and without left periorbital erythema.  Right Ear: Tympanic membrane, external ear and ear canal normal. No drainage or tenderness. No foreign bodies. Tympanic membrane is not injected, not scarred, not perforated, not erythematous, not retracted and not bulging. No middle ear effusion.  Left Ear: Tympanic membrane, external ear and ear canal normal. No drainage or tenderness. No foreign bodies. Tympanic membrane is not injected, not scarred, not perforated, not erythematous, not retracted and not bulging.  No middle ear effusion.  Nose: Nose normal. No mucosal edema, rhinorrhea, nose lacerations or sinus tenderness.  No foreign bodies.  Mouth/Throat: Oropharynx is clear and moist. No oropharyngeal exudate, posterior oropharyngeal edema, posterior oropharyngeal erythema or tonsillar abscesses.  Eyes: Lids are normal. Right eye exhibits no chemosis, no discharge and no exudate. No foreign body present in  the right eye. Left eye exhibits no chemosis, no discharge and no exudate. No foreign body present in the left eye. Right conjunctiva is not injected. Left conjunctiva is not injected.  Neck: Neck supple. No tracheal tenderness present. No tracheal deviation and no edema present. No thyroid mass and no thyromegaly present.  Cardiovascular: Normal rate, regular rhythm, S1 normal and S2 normal.  Exam reveals no gallop.   No murmur heard. Pulmonary/Chest: No accessory muscle usage or stridor. No respiratory distress. She has no wheezes. She has no rhonchi. She has no rales.  Abdominal: Soft.  Lymphadenopathy:       Head (right side): No tonsillar adenopathy present.       Head (left side): No tonsillar adenopathy present.    She has no cervical adenopathy.  Neurological: She is alert.  Skin: No rash noted. She is not diaphoretic.  Psychiatric: She has a normal mood and affect. Her behavior is normal.    Diagnostics:    Spirometry was performed and demonstrated an FEV1 of 1.92 at 89 % of predicted.  The patient had an Asthma Control Test with the following results:  .    Assessment and Plan:   1. Mild intermittent asthma, uncomplicated   2. LPRD (laryngopharyngeal reflux disease)   3. Allergic rhinoconjunctivitis      1. Treat reflux:   A. use omeprazole 40 mg twice a day  B. Use ranitidine 300 mg in the evening  C. Consolidate caffeine  and chocolate use  2. Prednisone 10 mg one tablet one time per day for 7 days  3. Can use ProAir HFA and Allegra if needed  4. Return to clinic in 4 weeks or earlier if problem  Saveria Will use a little bit more aggressive therapy directed against reflux and I given her some systemic steroids to help with inflammation of her respiratory tract and she will continue to use a short-acting bronchodilator as needed as well as an antihistamine. Certainly if she does not show improvement over the course of the next 4 weeks and we need to consider other  etiologic factors other than mild intermittent asthma and LPR as causing her symptoms.   Allena Katz, MD [place]

## 2016-01-01 ENCOUNTER — Encounter: Payer: Self-pay | Admitting: Allergy and Immunology

## 2016-01-01 ENCOUNTER — Ambulatory Visit (INDEPENDENT_AMBULATORY_CARE_PROVIDER_SITE_OTHER): Payer: BC Managed Care – PPO | Admitting: Allergy and Immunology

## 2016-01-01 VITALS — BP 104/74 | HR 76 | Resp 16

## 2016-01-01 DIAGNOSIS — K219 Gastro-esophageal reflux disease without esophagitis: Secondary | ICD-10-CM

## 2016-01-01 DIAGNOSIS — J309 Allergic rhinitis, unspecified: Secondary | ICD-10-CM | POA: Diagnosis not present

## 2016-01-01 DIAGNOSIS — J387 Other diseases of larynx: Secondary | ICD-10-CM

## 2016-01-01 DIAGNOSIS — J4520 Mild intermittent asthma, uncomplicated: Secondary | ICD-10-CM | POA: Diagnosis not present

## 2016-01-01 DIAGNOSIS — H1010 Acute atopic conjunctivitis, unspecified eye: Secondary | ICD-10-CM | POA: Diagnosis not present

## 2016-01-01 NOTE — Patient Instructions (Addendum)
    1. Treat reflux:   A. use omeprazole 40 mg twice a day  B. Use ranitidine 300 mg in the evening for an additional 8 weeks, then discontinue  C. Consolidate caffeine  and chocolate use  2. Can use ProAir HFA and Allegra if needed  3. Return to clinic in 1 year or earlier if problem

## 2016-01-01 NOTE — Progress Notes (Signed)
[facility]  Follow-up Note  Referring Provider: Leighton Ruff, MD Primary Provider: Gerrit Heck, MD Date of Office Visit: 01/01/2016  Subjective:   Morgan Johnston is a 60 y.o. female who returns to the [facility] in re-evaluation of the following:  HPI Comments:  Follow returns to this clinic on 12/31/2015 in reevaluation of her cough. She has been treated for mild intermittent asthma and LPR and allergic rhinoconjunctivitis over the course of the past month and has basically resolved her cough. She is using omeprazole 40 mg twice a day and ranitidine 300 mg in the evening and has made a effort to confirm consolidate her caffeine and chocolate. She rarely if ever uses any ProAir HFA. She has no shortness of breath or chest tightness. She has no problems with her nose.   Current Outpatient Prescriptions on File Prior to Visit  Medication Sig Dispense Refill  . albuterol (PROAIR HFA) 108 (90 BASE) MCG/ACT inhaler Inhale 2 puffs into the lungs every 4 (four) hours as needed for wheezing or shortness of breath.    . Calcium Carbonate (CALCIUM 600 PO) Take 1,200 mg by mouth daily.     . Cholecalciferol (VITAMIN D) 2000 UNITS CAPS Take 1 capsule by mouth daily.    . fexofenadine (ALLEGRA) 180 MG tablet Take 180 mg by mouth daily.    Marland Kitchen FIBER PO Take 2 tablets by mouth daily.    Marland Kitchen ibandronate (BONIVA) 150 MG tablet Take 150 mg by mouth every 30 (thirty) days.     . mometasone (NASONEX) 50 MCG/ACT nasal spray Place 2 sprays into the nose daily as needed.    Marland Kitchen omeprazole (PRILOSEC) 40 MG capsule Take 1 capsule (40 mg total) by mouth 2 (two) times daily. 60 capsule 5  . Probiotic Product (PROBIOTIC DAILY PO) Take by mouth.    . ranitidine (ZANTAC) 300 MG tablet Take 1 tablet (300 mg total) by mouth at bedtime. 30 tablet 5  . simvastatin (ZOCOR) 20 MG tablet Take 20 mg by mouth every evening.  5   No current  facility-administered medications on file prior to visit.    No orders of the defined types were placed in this encounter.    Past Medical History  Diagnosis Date  . Hyperlipidemia     takes Simvastatin nightly  . Pneumonia 2012  . Bronchitis 2012  . Hx of seasonal allergies     takes Allegra daily  . Headache(784.0)     occasionally  . Dizziness     r/t menstrual cycle;was given Meclizine and no problems in over a yr  . Arthritis   . Joint pain   . Joint swelling   . Back pain     DDD  . Constipation   . Anxiety     but doesn't require any meds  . Insomnia     d/t neck pain and menopause    Past Surgical History  Procedure Laterality Date  . Tonsillectomy      at age 7  . Carpal tunnel release      bilateral  . 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 Colonoscopy    . Anterior cervical decomp/discectomy fusion  06/07/2012    Procedure: ANTERIOR CERVICAL DECOMPRESSION/DISCECTOMY FUSION 2 LEVELS;  Surgeon: Melina Schools, MD;  Location: [facility];  Service: Orthopedics;  Laterality: N/A;  ACDF  C4-5, 5-6    Allergies  Allergen Reactions  . Augmentin [Amoxicillin-Pot Clavulanate] Anaphylaxis    Pt gets a pre-anaphylaxis reaction  . Codeine Other (See Comments)    REACTION: dizziness/nausea  . Hydrocodone   . Oxycodone     Review of systems negative except as noted in HPI / PMHx or noted below:  Review of Systems  Constitutional: Negative.   HENT: Negative.   Eyes: Negative.   Respiratory: Negative.   Cardiovascular: Negative.   Gastrointestinal: Negative.   Genitourinary: Negative.   Musculoskeletal: Negative.   Skin: Negative.   Neurological: Negative.   Endo/Heme/Allergies: Negative.   Psychiatric/Behavioral: Negative.      Objective:   Filed Vitals:   01/01/16 0851  BP: 104/74  Pulse: 76  Resp: 16          Physical Exam  Constitutional: She is well-developed, well-nourished,  and in no distress. No distress.  HENT:  Head: Normocephalic.  Right Ear: Tympanic membrane, external ear and ear canal normal.  Left Ear: Tympanic membrane, external ear and ear canal normal.  Nose: Nose normal. No mucosal edema or rhinorrhea.  Mouth/Throat: Uvula is midline, oropharynx is clear and moist and mucous membranes are normal. No oropharyngeal exudate.  Eyes: Conjunctivae are normal.  Neck: Trachea normal. No tracheal tenderness present. No tracheal deviation present. No thyromegaly present.  Cardiovascular: Normal rate, regular rhythm, S1 normal, S2 normal and normal heart sounds.   No murmur heard. Pulmonary/Chest: Breath sounds normal. No stridor. No respiratory distress. She has no wheezes. She has no rales.  Musculoskeletal: She exhibits no edema.  Lymphadenopathy:       Head (right side): No tonsillar adenopathy present.       Head (left side): No tonsillar adenopathy present.    She has no cervical adenopathy.    She has no axillary adenopathy.  Neurological: She is alert. Gait normal.  Skin: No rash noted. She is not diaphoretic. No erythema. Nails show no clubbing.  Psychiatric: Mood and affect normal.    Diagnostics: None  Assessment and Plan:   1. Mild intermittent asthma, uncomplicated   2. LPRD (laryngopharyngeal reflux disease)   3. Allergic rhinoconjunctivitis      1. Treat reflux:   A. use omeprazole 40 mg twice a day  B. Use ranitidine 300 mg in the evening for an additional 8 weeks, then discontinue  C. Consolidate caffeine  and chocolate use  2. Can use ProAir HFA and Allegra if needed  3. Return to clinic in 1 year or earlier if problem  Petronia is doing quite well on her current medical therapy and we will now see if we can consolidate her ranitidine in approximately 8 weeks which would give her a total of 12 weeks of this medication. I think by modification of her behavior with decrease in her caffeine and chocolate consumption she will  probably do very well without the use of ranitidine. If she continues to do well we'll just see her back in this clinic in 1 year or earlier if there is a problem. Certainly if she has any problems with her nose or chest or asthma or has a increased requirement for ProAir HFA we need to have her undergo further evaluation and treatment.  Allena Katz, MD [place]

## 2016-02-23 ENCOUNTER — Other Ambulatory Visit: Payer: Self-pay | Admitting: Family Medicine

## 2016-02-23 DIAGNOSIS — M8580 Other specified disorders of bone density and structure, unspecified site: Secondary | ICD-10-CM

## 2016-02-24 ENCOUNTER — Emergency Department (HOSPITAL_COMMUNITY): Payer: BC Managed Care – PPO

## 2016-02-24 ENCOUNTER — Encounter (HOSPITAL_COMMUNITY): Payer: Self-pay | Admitting: Emergency Medicine

## 2016-02-24 DIAGNOSIS — R200 Anesthesia of skin: Secondary | ICD-10-CM | POA: Diagnosis not present

## 2016-02-24 LAB — BASIC METABOLIC PANEL
ANION GAP: 8 (ref 5–15)
BUN: 12 mg/dL (ref 6–20)
CALCIUM: 9.7 mg/dL (ref 8.9–10.3)
CO2: 29 mmol/L (ref 22–32)
Chloride: 104 mmol/L (ref 101–111)
Creatinine, Ser: 0.81 mg/dL (ref 0.44–1.00)
GFR calc Af Amer: >60 mL/min (ref >60)
Glucose, Bld: 99 mg/dL (ref 65–99)
POTASSIUM: 3.8 mmol/L (ref 3.5–5.1)
SODIUM: 141 mmol/L (ref 135–145)

## 2016-02-24 LAB — CBC
HEMATOCRIT: 42 % (ref 36.0–46.0)
HEMOGLOBIN: 14.2 g/dL (ref 12.0–15.0)
MCH: 31.2 pg (ref 26.0–34.0)
MCHC: 33.8 g/dL (ref 30.0–36.0)
MCV: 92.3 fL (ref 78.0–100.0)
Platelets: 222 10*3/uL (ref 150–400)
RBC: 4.55 MIL/uL (ref 3.87–5.11)
RDW: 12.4 % (ref 11.5–15.5)
WBC: 7.3 10*3/uL (ref 4.0–10.5)

## 2016-02-24 LAB — I-STAT TROPONIN, ED: TROPONIN I, POC: 0 ng/mL (ref 0.00–0.08)

## 2016-02-24 NOTE — ED Notes (Signed)
Pt c/o L arm numbness that started 2 hours ago that radiates into neck and jaw. No drift or droop. Grip strengths equal. Pt denies cp, sob. Dr. Christy Gentles notified- No code stroke. ekg completed in triage. nad noted.

## 2016-02-25 ENCOUNTER — Emergency Department (HOSPITAL_COMMUNITY)
Admission: EM | Admit: 2016-02-25 | Discharge: 2016-02-25 | Disposition: A | Discharge status: Home / Self Care | Payer: BC Managed Care – PPO | Attending: Emergency Medicine | Admitting: Emergency Medicine

## 2016-02-25 NOTE — ED Notes (Signed)
Pt states she is leaving  

## 2016-03-11 ENCOUNTER — Inpatient Hospital Stay: Admission: RE | Admit: 2016-03-11 | Payer: BC Managed Care – PPO | Source: Ambulatory Visit

## 2016-03-30 ENCOUNTER — Ambulatory Visit
Admission: RE | Admit: 2016-03-30 | Discharge: 2016-03-30 | Disposition: A | Discharge status: Home / Self Care | Payer: BC Managed Care – PPO | Source: Ambulatory Visit | Attending: Family Medicine | Admitting: Family Medicine

## 2016-03-30 DIAGNOSIS — M8580 Other specified disorders of bone density and structure, unspecified site: Secondary | ICD-10-CM

## 2016-05-05 ENCOUNTER — Other Ambulatory Visit: Payer: Self-pay | Admitting: Family Medicine

## 2016-05-05 DIAGNOSIS — N281 Cyst of kidney, acquired: Secondary | ICD-10-CM

## 2016-05-09 ENCOUNTER — Ambulatory Visit
Admission: RE | Admit: 2016-05-09 | Discharge: 2016-05-09 | Disposition: A | Discharge status: Home / Self Care | Payer: BC Managed Care – PPO | Source: Ambulatory Visit | Attending: Family Medicine | Admitting: Family Medicine

## 2016-05-09 DIAGNOSIS — N281 Cyst of kidney, acquired: Secondary | ICD-10-CM

## 2016-11-19 ENCOUNTER — Other Ambulatory Visit: Payer: Self-pay | Admitting: Allergy and Immunology

## 2017-01-02 ENCOUNTER — Ambulatory Visit: Payer: BC Managed Care – PPO | Admitting: Allergy and Immunology

## 2017-01-04 ENCOUNTER — Other Ambulatory Visit: Payer: Self-pay | Admitting: Allergy and Immunology

## 2017-01-09 ENCOUNTER — Ambulatory Visit (INDEPENDENT_AMBULATORY_CARE_PROVIDER_SITE_OTHER): Payer: BC Managed Care – PPO | Admitting: Allergy and Immunology

## 2017-01-09 ENCOUNTER — Encounter: Payer: Self-pay | Admitting: Allergy and Immunology

## 2017-01-09 VITALS — BP 122/78 | HR 78 | Temp 97.8°F | Resp 16

## 2017-01-09 DIAGNOSIS — J4520 Mild intermittent asthma, uncomplicated: Secondary | ICD-10-CM | POA: Diagnosis not present

## 2017-01-09 DIAGNOSIS — K219 Gastro-esophageal reflux disease without esophagitis: Secondary | ICD-10-CM | POA: Diagnosis not present

## 2017-01-09 MED ORDER — MOMETASONE FUROATE 50 MCG/ACT NA SUSP: NASAL | 5 refills | Status: DC

## 2017-01-09 MED ORDER — METHYLPREDNISOLONE ACETATE 80 MG/ML IJ SUSP
80.0000 mg | Freq: Once | INTRAMUSCULAR | Status: AC
  Administered 2017-01-09: 80 mg via INTRAMUSCULAR

## 2017-01-09 MED ORDER — ALBUTEROL SULFATE HFA 108 (90 BASE) MCG/ACT IN AERS: INHALATION_SPRAY | RESPIRATORY_TRACT | 1 refills | Status: DC

## 2017-01-09 MED ORDER — RANITIDINE HCL 300 MG PO TABS: ORAL_TABLET | ORAL | 5 refills | Status: DC

## 2017-01-09 NOTE — Patient Instructions (Addendum)
    1. Treat reflux aggressively while "sick":   A. increase omeprazole 40 mg twice a day  B. Use ranitidine 300 mg in the evening for 4 weeks, then discontinue  2. Can continue Nasonex 1-2 sprays each nostril one time per day  3. Can use ProAir HFA and Allegra if needed  4. Depo-Medrol 80 IM delivered in clinic today  5. Return to clinic in 1 year or earlier if problem

## 2017-01-09 NOTE — Progress Notes (Signed)
Follow-up Note  Referring Provider: Leighton Ruff, MD Primary Provider: Gerrit Heck, MD Date of Office Visit: 01/09/2017  Subjective:   Morgan Johnston (DOB: [birth year]) is a 61 y.o. female who returns to the [facility] on 01/09/2017 in re-evaluation of the following:  HPI: Dovey returns to this clinic in reevaluation of her chronic cough with a component of mild intermittent asthma, LPR, and allergic rhinoconjunctivitis. I've not seen her in this clinic since 01/01/2016.  She had her cough under excellent control while consistently and aggressively treating reflux and she was able to taper down her medications to the point where she is now just using omeprazole 40 mg 1 time per day. She rarely if ever uses ProAir HFA and occasionally she will use an antihistamine and some nasal steroids for rhinitis.  Unfortunately, one day after returning from a trip to [place] she developed laryngitis and sore throat and head congestion and sneezing and rhinorrhea and coughing. She's been stuck in that pattern for about 2 weeks. Her head is improved somewhat yet she still continues to cough and has some throat clearing and some drainage in her throat. She has no sputum production and no chest pain and no fever and no ugly nasal discharge.  She did receive the flu vaccine this year.  Allergies as of 01/09/2017      Reactions   Augmentin [amoxicillin-pot Clavulanate] Anaphylaxis   Pt gets a pre-anaphylaxis reaction   Codeine Other (See Comments)   REACTION: dizziness/nausea   Hydrocodone    Oxycodone       Medication List      CALCIUM 600 PO Take 1,200 mg by mouth daily.   fexofenadine 180 MG tablet Commonly known as:  ALLEGRA Take 180 mg by mouth daily.   FIBER PO Take 2 tablets by mouth daily.   ibandronate 150 MG tablet Commonly known as:  BONIVA Take 150 mg by mouth every 30 (thirty) days.   mometasone 50 MCG/ACT nasal spray Commonly known as:   NASONEX Place 2 sprays into the nose daily as needed.   omeprazole 40 MG capsule Commonly known as:  PRILOSEC TAKE 1 CAPSULE (40 MG TOTAL) BY MOUTH 2 (TWO) TIMES DAILY.   PARoxetine Mesylate 7.5 MG Caps   PROAIR HFA 108 (90 Base) MCG/ACT inhaler Generic drug:  albuterol Inhale 2 puffs into the lungs every 4 (four) hours as needed for wheezing or shortness of breath.   PROBIOTIC DAILY PO Take by mouth.   ranitidine 300 MG tablet Commonly known as:  ZANTAC Take 1 tablet (300 mg total) by mouth at bedtime.   simvastatin 20 MG tablet Commonly known as:  ZOCOR Take 20 mg by mouth every evening.   Vitamin D 2000 units Caps Take 1 capsule by mouth daily.       Past Medical History:  Diagnosis Date  . Anxiety    but doesn't require any meds  . Arthritis   . Back pain    DDD  . Bronchitis 2012  . Constipation   . Dizziness    r/t menstrual cycle;was given Meclizine and no problems in over a yr  . Headache(784.0)    occasionally  . Hx of seasonal allergies    takes Allegra daily  . Hyperlipidemia    takes Simvastatin nightly  . Insomnia    d/t neck pain and menopause  . Joint pain   . Joint swelling   . Pneumonia 2012    Past Surgical History:  Procedure Laterality Date  . ANTERIOR CERVICAL DECOMP/DISCECTOMY FUSION  06/07/2012   Procedure: ANTERIOR CERVICAL DECOMPRESSION/DISCECTOMY FUSION 2 LEVELS;  Surgeon: Melina Schools, MD;  Location: [facility];  Service: Orthopedics;  Laterality: N/A;  ACDF C4-5, 5-6  . CARPAL TUNNEL RELEASE     bilateral  . COLONOSCOPY    . 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 TONSILLECTOMY     at age 77    Review of systems negative except as noted in HPI / PMHx or noted below:  Review of Systems  Constitutional: Negative.   HENT: Negative.   Eyes: Negative.   Respiratory: Negative.   Cardiovascular: Negative.   Gastrointestinal: Negative.   Genitourinary: Negative.     Musculoskeletal: Negative.   Skin: Negative.   Neurological: Negative.   Endo/Heme/Allergies: Negative.   Psychiatric/Behavioral: Negative.      Objective:   Vitals:   01/09/17 1511  BP: 122/78  Pulse: 78  Resp: 16  Temp: 97.8 F (36.6 C)          Physical Exam  Constitutional: She is well-developed, well-nourished, and in no distress.  Coughing, throat clearing  HENT:  Head: Normocephalic.  Right Ear: Tympanic membrane, external ear and ear canal normal.  Left Ear: Tympanic membrane, external ear and ear canal normal.  Nose: Nose normal. No mucosal edema or rhinorrhea.  Mouth/Throat: Uvula is midline, oropharynx is clear and moist and mucous membranes are normal. No oropharyngeal exudate.  Eyes: Conjunctivae are normal.  Neck: Trachea normal. No tracheal tenderness present. No tracheal deviation present. No thyromegaly present.  Cardiovascular: Normal rate, regular rhythm, S1 normal, S2 normal and normal heart sounds.   No murmur heard. Pulmonary/Chest: Breath sounds normal. No stridor. No respiratory distress. She has no wheezes. She has no rales.  Musculoskeletal: She exhibits no edema.  Lymphadenopathy:       Head (right side): No tonsillar adenopathy present.       Head (left side): No tonsillar adenopathy present.    She has no cervical adenopathy.  Neurological: She is alert. Gait normal.  Skin: No rash noted. She is not diaphoretic. No erythema. Nails show no clubbing.  Psychiatric: Mood and affect normal.    Diagnostics:    Spirometry was performed and demonstrated an FEV1 of 1.68 at 76 % of predicted.  The patient had an Asthma Control Test with the following results: ACT Total Score: 22.    Assessment and Plan:   1. Mild intermittent asthma, uncomplicated   2. LPRD (laryngopharyngeal reflux disease)      1. Treat reflux aggressively while "sick":   A. increase omeprazole 40 mg twice a day  B. Use ranitidine 300 mg in the evening for 4 weeks,  then discontinue  2. Can continue Nasonex 1-2 sprays each nostril one time per day  3. Can use ProAir HFA and Allegra if needed  4. Depo-Medrol 80 IM delivered in clinic today  5. Return to clinic in 1 year or earlier if problem  I will assume that Zer has developed some type of viral respiratory tract syndrome given rise to significant inflammation of her head and throat and chest and treat her with anti-inflammatory agents in the form of a systemic steroid. As well, given the fact that her cough of long-standing duration appeared to be secondary to her LPR in the past I will once again have her aggressively treat this condition with omeprazole twice  a day and ranitidine in the evening. She will keep in contact with me noting her response to this approach. If she does well I will see her back in this clinic in 1 year or earlier if there is a problem.  Allena Katz, MD [place]

## 2017-02-04 ENCOUNTER — Other Ambulatory Visit: Payer: Self-pay | Admitting: Allergy and Immunology

## 2017-12-19 HISTORY — PX: JOINT REPLACEMENT: SHX530

## 2017-12-19 HISTORY — PX: OTHER SURGICAL HISTORY: SHX169

## 2018-01-17 ENCOUNTER — Other Ambulatory Visit: Payer: Self-pay | Admitting: Family Medicine

## 2018-01-17 DIAGNOSIS — M8580 Other specified disorders of bone density and structure, unspecified site: Secondary | ICD-10-CM

## 2018-04-02 ENCOUNTER — Ambulatory Visit
Admission: RE | Admit: 2018-04-02 | Discharge: 2018-04-02 | Disposition: A | Discharge status: Home / Self Care | Payer: BC Managed Care – PPO | Source: Ambulatory Visit | Attending: Family Medicine | Admitting: Family Medicine

## 2018-04-02 DIAGNOSIS — M8580 Other specified disorders of bone density and structure, unspecified site: Secondary | ICD-10-CM

## 2019-07-09 ENCOUNTER — Other Ambulatory Visit: Payer: Self-pay | Admitting: Gastroenterology

## 2019-07-17 ENCOUNTER — Encounter: Payer: Self-pay | Admitting: Gastroenterology

## 2019-07-26 ENCOUNTER — Other Ambulatory Visit (HOSPITAL_COMMUNITY)
Admission: RE | Admit: 2019-07-26 | Discharge: 2019-07-26 | Disposition: A | Discharge status: Home / Self Care | Payer: BC Managed Care – PPO | Source: Ambulatory Visit | Attending: Gastroenterology | Admitting: Gastroenterology

## 2019-07-26 DIAGNOSIS — Z20828 Contact with and (suspected) exposure to other viral communicable diseases: Secondary | ICD-10-CM | POA: Diagnosis not present

## 2019-07-27 LAB — SARS CORONAVIRUS 2 (TAT 6-24 HRS): SARS Coronavirus 2: NEGATIVE

## 2019-07-29 NOTE — Progress Notes (Signed)
Pt called to make [place] aware that she had a slight fever and headache yesterday. Talked with our Surveyor, quantity and felt it is safest to reschedule her procedure for a later date. Procedure is cancelled. And pt will call dr. Oletta Lamas to reschedule. Dr. Oletta Lamas office made aware.

## 2019-07-30 ENCOUNTER — Other Ambulatory Visit: Payer: Self-pay | Admitting: Gastroenterology

## 2019-08-12 ENCOUNTER — Other Ambulatory Visit (HOSPITAL_COMMUNITY)
Admission: RE | Admit: 2019-08-12 | Discharge: 2019-08-12 | Disposition: A | Discharge status: Home / Self Care | Payer: BC Managed Care – PPO | Source: Ambulatory Visit | Attending: Gastroenterology | Admitting: Gastroenterology

## 2019-08-12 DIAGNOSIS — Z01812 Encounter for preprocedural laboratory examination: Secondary | ICD-10-CM | POA: Diagnosis not present

## 2019-08-12 DIAGNOSIS — Z20828 Contact with and (suspected) exposure to other viral communicable diseases: Secondary | ICD-10-CM | POA: Diagnosis not present

## 2019-08-13 LAB — SARS CORONAVIRUS 2 (TAT 6-24 HRS): SARS Coronavirus 2: NEGATIVE

## 2019-08-14 ENCOUNTER — Other Ambulatory Visit: Payer: Self-pay

## 2019-08-14 ENCOUNTER — Encounter (HOSPITAL_COMMUNITY): Payer: Self-pay | Admitting: *Deleted

## 2019-08-14 NOTE — Anesthesia Preprocedure Evaluation (Addendum)
Anesthesia Evaluation  Patient identified by MRN, date of birth, ID band Patient awake    Reviewed: Allergy & Precautions, NPO status , Patient's Chart, lab work & pertinent test results  Airway Mallampati: II  TM Distance: >3 FB Neck ROM: Full    Dental no notable dental hx.    Pulmonary asthma ,    Pulmonary exam normal breath sounds clear to auscultation       Cardiovascular negative cardio ROS Normal cardiovascular exam Rhythm:Regular Rate:Normal     Neuro/Psych  Headaches,    GI/Hepatic Neg liver ROS, GERD  Medicated and Controlled,  Endo/Other  negative endocrine ROS  Renal/GU negative Renal ROS     Musculoskeletal negative musculoskeletal ROS (+) Back pain Joint pain   Abdominal   Peds  Hematology HLD   Anesthesia Other Findings Family history of colon cancer in sister/Tortuous colon SCREENING COLON CANCER  Reproductive/Obstetrics                           Anesthesia Physical Anesthesia Plan  ASA: II  Anesthesia Plan: MAC   Post-op Pain Management:    Induction:   PONV Risk Score and Plan: 2 and Propofol infusion and Treatment may vary due to age or medical condition  Airway Management Planned:   Additional Equipment:   Intra-op Plan:   Post-operative Plan:   Informed Consent: I have reviewed the patients History and Physical, chart, labs and discussed the procedure including the risks, benefits and alternatives for the proposed anesthesia with the patient or authorized representative who has indicated his/her understanding and acceptance.     Dental advisory given  Plan Discussed with: CRNA  Anesthesia Plan Comments:       Anesthesia Quick Evaluation

## 2019-08-15 ENCOUNTER — Ambulatory Visit (HOSPITAL_COMMUNITY): Payer: BC Managed Care – PPO | Admitting: Certified Registered Nurse Anesthetist

## 2019-08-15 ENCOUNTER — Encounter (HOSPITAL_COMMUNITY): Payer: Self-pay | Admitting: Gastroenterology

## 2019-08-15 ENCOUNTER — Encounter (HOSPITAL_COMMUNITY)
Admission: RE | Disposition: A | Discharge status: Home / Self Care | Payer: Self-pay | Source: Ambulatory Visit | Attending: Gastroenterology

## 2019-08-15 ENCOUNTER — Ambulatory Visit (HOSPITAL_COMMUNITY)
Admission: RE | Admit: 2019-08-15 | Discharge: 2019-08-15 | Disposition: A | Discharge status: Home / Self Care | Payer: BC Managed Care – PPO | Source: Ambulatory Visit | Attending: Gastroenterology | Admitting: Gastroenterology

## 2019-08-15 DIAGNOSIS — Z79899 Other long term (current) drug therapy: Secondary | ICD-10-CM | POA: Diagnosis not present

## 2019-08-15 DIAGNOSIS — E785 Hyperlipidemia, unspecified: Secondary | ICD-10-CM | POA: Diagnosis not present

## 2019-08-15 DIAGNOSIS — Z880 Allergy status to penicillin: Secondary | ICD-10-CM | POA: Diagnosis not present

## 2019-08-15 DIAGNOSIS — Q438 Other specified congenital malformations of intestine: Secondary | ICD-10-CM | POA: Insufficient documentation

## 2019-08-15 DIAGNOSIS — J45909 Unspecified asthma, uncomplicated: Secondary | ICD-10-CM | POA: Insufficient documentation

## 2019-08-15 DIAGNOSIS — Z87892 Personal history of anaphylaxis: Secondary | ICD-10-CM | POA: Insufficient documentation

## 2019-08-15 DIAGNOSIS — Z885 Allergy status to narcotic agent status: Secondary | ICD-10-CM | POA: Insufficient documentation

## 2019-08-15 DIAGNOSIS — Z800 Family history of malignant neoplasm of digestive organs: Secondary | ICD-10-CM | POA: Diagnosis present

## 2019-08-15 DIAGNOSIS — Z881 Allergy status to other antibiotic agents status: Secondary | ICD-10-CM | POA: Diagnosis not present

## 2019-08-15 DIAGNOSIS — K5900 Constipation, unspecified: Secondary | ICD-10-CM | POA: Insufficient documentation

## 2019-08-15 DIAGNOSIS — K219 Gastro-esophageal reflux disease without esophagitis: Secondary | ICD-10-CM | POA: Insufficient documentation

## 2019-08-15 DIAGNOSIS — Z1211 Encounter for screening for malignant neoplasm of colon: Secondary | ICD-10-CM | POA: Insufficient documentation

## 2019-08-15 DIAGNOSIS — M1990 Unspecified osteoarthritis, unspecified site: Secondary | ICD-10-CM | POA: Diagnosis not present

## 2019-08-15 HISTORY — PX: COLONOSCOPY WITH PROPOFOL: SHX5780

## 2019-08-15 HISTORY — DX: Unspecified asthma, uncomplicated: J45.909

## 2019-08-15 SURGERY — COLONOSCOPY
Anesthesia: Monitor Anesthesia Care

## 2019-08-15 SURGERY — COLONOSCOPY WITH PROPOFOL
Anesthesia: Monitor Anesthesia Care

## 2019-08-15 MED ORDER — PROPOFOL 10 MG/ML IV BOLUS
INTRAVENOUS | Status: DC | PRN
  Administered 2019-08-15: 40 mg via INTRAVENOUS

## 2019-08-15 MED ORDER — LACTATED RINGERS IV SOLN
INTRAVENOUS | Status: DC
  Administered 2019-08-15: 08:00:00 via INTRAVENOUS

## 2019-08-15 MED ORDER — SODIUM CHLORIDE 0.9 % IV SOLN: INTRAVENOUS | Status: DC

## 2019-08-15 MED ORDER — PROPOFOL 500 MG/50ML IV EMUL
INTRAVENOUS | Status: DC | PRN
  Administered 2019-08-15: 125 ug/kg/min via INTRAVENOUS

## 2019-08-15 MED ORDER — PROPOFOL 10 MG/ML IV BOLUS
INTRAVENOUS | Status: AC
  Filled 2019-08-15: qty 60

## 2019-08-15 MED ORDER — PROPOFOL 10 MG/ML IV BOLUS
INTRAVENOUS | Status: AC
  Filled 2019-08-15: qty 40

## 2019-08-15 SURGICAL SUPPLY — 22 items

## 2019-08-15 NOTE — Anesthesia Procedure Notes (Signed)
Procedure Name: MAC Date/Time: 08/15/2019 8:35 AM Performed by: Claudia Desanctis, CRNA Oxygen Delivery Method: Simple face mask

## 2019-08-15 NOTE — Op Note (Signed)
[facility] Patient Name: Morgan Johnston Procedure Date: 08/15/2019 MRN: [id number] Attending MD: Nancy Fetter Dr., MD Date of Birth: 04-17-1956 CSN: [id number] Age: 63 Admit Type: Outpatient Procedure:                Colonoscopy Indications:              Screening in patient at increased risk: Family                            history of 1st-degree relative with colorectal                            cancer before age 25 years. Her sister had colon                            cancer at age 67. Previous colonoscopy attempt by                            another gastroenterologist was unsuccessful.                            Subsequent barium enema and virtual colonoscopy                            showed extremely long tortuous colon. This is done                            is a 10-year follow-up. Providers:                Joyice Faster. Zakhia Seres Dr., MD, Grace Isaac, RN, Marguerita Merles, Technician Referring MD:              Medicines:                Monitored Anesthesia Care Complications:            No immediate complications. Estimated Blood Loss:     Estimated blood loss: none. Procedure:                Pre-Anesthesia Assessment:                           - Prior to the procedure, a History and Physical                            was performed, and patient medications and                            allergies were reviewed. The patient's tolerance of                            previous anesthesia was also reviewed. The risks                            and benefits  of the procedure and the sedation                            options and risks were discussed with the patient.                            All questions were answered, and informed consent                            was obtained. Prior Anticoagulants: The patient has                            taken no previous anticoagulant or antiplatelet                            agents. ASA Grade  Assessment: II - A patient with                            mild systemic disease. After reviewing the risks                            and benefits, the patient was deemed in                            satisfactory condition to undergo the procedure.                           After obtaining informed consent, the colonoscope                            was passed under direct vision. Throughout the                            procedure, the patient's blood pressure, pulse, and                            oxygen saturations were monitored continuously. The                            [id number]-Z025 ([id number]) Olympus enteroscope was                            introduced through the anus and advanced to the the                            hepatic flexure. The colonoscopy was extremely                            difficult due to a redundant colon, significant                            looping and a tortuous colon. Successful completion  of the procedure was aided by changing the patient                            to a supine position and applying abdominal                            pressure.We initially started with the small bowel                            enteroscope and had this fully inserted and had not                            yet reached the cecum despite position changes and                            abdominal pressure and change to the adult                            colonoscope. Even with it adjusted for maximum                            stiffness were unable to reach the cecum. Scope In: 8:39:29 AM Scope Out: 9:12:10 AM Total Procedure Duration: 0 hours 32 minutes 41 seconds  Findings:      The perianal and digital rectal examinations were normal.      There is no endoscopic evidence of bleeding, diverticula, mass, polyps,       stricture or tumor in the recto-sigmoid colon, in the sigmoid colon, in       the descending colon and in the transverse colon. We  advanced the small       bowel enteroscope to the full extent and were unsuccessful passing the       proximal transverse colon area and switched to the adult colonoscope.       Even with this set with maximum stiffness were unable to advance any       further. The scope was withdrawn and no lesions were seen on withdrawal.      The retroflexed view of the distal rectum and anal verge was normal and       showed no anal or rectal abnormalities. Impression:               - The distal rectum and anal verge are normal on                            retroflexion view.                           - No specimens collected.                           - Family History of Colorectal Cancer Moderate Sedation:      MAC by anesthesia Recommendation:           - Patient has a contact number available for  emergencies. The signs and symptoms of potential                            delayed complications were discussed with the                            patient. Return to normal activities tomorrow.                            Written discharge instructions were provided to the                            patient.                           - Resume previous diet.                           - Continue present medications.                           - Perform a virtual colonoscopy at appointment to                            be scheduled. Procedure Code(s):        --- Professional ---                           [phone], 77, Colonoscopy, flexible; diagnostic,                            including collection of specimen(s) by brushing or                            washing, when performed (separate procedure) Diagnosis Code(s):        --- Professional ---                           Z80.0, Family history of malignant neoplasm of                            digestive organs CPT copyright 2019 American Medical Association. All rights reserved. The codes documented in this report are preliminary  and upon coder review may  be revised to meet current compliance requirements. Nancy Fetter Dr., MD 08/15/2019 9:34:52 AM This report has been signed electronically. Number of Addenda: 0

## 2019-08-15 NOTE — Transfer of Care (Signed)
Immediate Anesthesia Transfer of Care Note  Patient: Morgan Johnston  Procedure(s) Performed: COLONOSCOPY WITH PROPOFOL (N/A )  Patient Location: Endoscopy Unit  Anesthesia Type:MAC  Level of Consciousness: awake, alert , oriented and patient cooperative  Airway & Oxygen Therapy: Patient Spontanous Breathing and Patient connected to face mask  Post-op Assessment: Report given to RN and Post -op Vital signs reviewed and stable  Post vital signs: Reviewed and stable  Last Vitals:  Vitals Value Taken Time  BP 105/45 08/15/19 0920  Temp 36.5 C 08/15/19 0920  Pulse 74 08/15/19 0920  Resp 20 08/15/19 0920  SpO2 97 % 08/15/19 0920  Vitals shown include unvalidated device data.  Last Pain:  Vitals:   08/15/19 0920  TempSrc: Temporal  PainSc: 0-No pain         Complications: No apparent anesthesia complications

## 2019-08-15 NOTE — H&P (Signed)
Subjective:   Patient is a 63 y.o. female presents with plan for colonoscopy.  She had colonoscopy around 10 years ago by another GI doctor that was incomplete due to an extremely tortuous colon.  Had barium enema at that time and then a virtual colonoscopy.  Both of these studies colonic malrotation very tortuous colon was noted.  She has a strong family history of colon cancer in that her sister had colon cancer at age 70.  She has been constipated entire life takes a lot of laxatives.. Procedure including risks and benefits discussed in office.  Patient Active Problem List   Diagnosis Date Noted  . Mild intermittent asthma 12/04/2015  . LPRD (laryngopharyngeal reflux disease) 12/04/2015  . Allergic rhinoconjunctivitis 12/04/2015  . Family history of colon cancer 02/06/2015  . Chest pain 01/26/2015  . Dyslipidemia 01/26/2015  . GERD (gastroesophageal reflux disease) 01/26/2015  . Bloating 10/16/2014  . CN (constipation) 10/16/2014  . Redundant colon 10/16/2014   Past Medical History:  Diagnosis Date  . Arthritis   . Asthma    CAUSED BY ACID REFLUX  . Back pain    DDD  . Bronchitis 2012  . Constipation   . Dizziness    r/t menstrual cycle;was given Meclizine and no problems in over a yr  . Headache(784.0)    occasionally  . Hx of seasonal allergies    takes Allegra daily  . Hyperlipidemia    takes Simvastatin nightly  . Insomnia    d/t neck pain and menopause  . Joint pain   . Joint swelling   . Pneumonia 2012    Past Surgical History:  Procedure Laterality Date  . ANTERIOR CERVICAL DECOMP/DISCECTOMY FUSION  06/07/2012   Procedure: ANTERIOR CERVICAL DECOMPRESSION/DISCECTOMY FUSION 2 LEVELS;  Surgeon: Melina Schools, MD;  Location: [facility];  Service: Orthopedics;  Laterality: N/A;  ACDF C4-5, 5-6  . CARPAL TUNNEL RELEASE     bilateral  . COLONOSCOPY    . EYE SURGERY Bilateral    CATARACT SURGERY  . 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 JOINT REPLACEMENT  2019   LEFT HAND [place] JOINT REPLACMENT  . LEFT HAND CYST REMOVED  2019  . TONSILLECTOMY     at age 17    Medications Prior to Admission  Medication Sig Dispense Refill Last Dose  . Calcium Carbonate (CALCIUM 600 PO) Take 600 mg by mouth daily.    08/14/2019 at Unknown time  . fexofenadine (ALLEGRA) 180 MG tablet Take 180 mg by mouth daily.   08/14/2019 at Unknown time  . ibandronate (BONIVA) 150 MG tablet Take 150 mg by mouth every 30 (thirty) days.    Past Month at Unknown time  . omeprazole (PRILOSEC) 40 MG capsule TAKE 1 CAPSULE (40 MG TOTAL) BY MOUTH 2 (TWO) TIMES DAILY. (Patient taking differently: Take 40 mg by mouth at bedtime. ) 60 capsule 9 Past Week at Unknown time  . paroxetine mesylate (PEXEVA) 10 MG tablet Take 10 mg by mouth at bedtime.   Past Week at Unknown time  . Probiotic Product (PROBIOTIC DAILY PO) Take 1 capsule by mouth daily.    08/14/2019 at Unknown time  . simvastatin (ZOCOR) 20 MG tablet Take 20 mg by mouth every evening.  5 Past Week at Unknown time  . VITAMIN D PO Take 3,000 Units by mouth daily.    08/14/2019 at Unknown time  . albuterol (PROAIR HFA)  108 (90 Base) MCG/ACT inhaler Inhale two puffs every 4-6 hours if needed for cough or wheeze 1 Inhaler 1 More than a month at Unknown time   Allergies  Allergen Reactions  . Augmentin [Amoxicillin-Pot Clavulanate] Anaphylaxis    Pt gets a pre-anaphylaxis reaction  . Codeine Other (See Comments)    REACTION: dizziness/nausea  . Hydrocodone   . Oxycodone     Social History   Tobacco Use  . Smoking status: Never Smoker  . Smokeless tobacco: Never Used  Substance Use Topics  . Alcohol use: Yes    Comment: RARE Q MONTH    Family History  Problem Relation Age of Onset  . Colon polyps Father   . Diabetes Father   . Kidney disease Father 54       Renal Cell carcinoma  . Hypertension Father   . High Cholesterol Father   . Diabetes Mother   . Allergic  rhinitis Mother   . Asthma Mother   . Hypertension Mother   . High Cholesterol Mother   . Colon cancer Sister   . Diabetes Brother   . Allergic rhinitis Brother   . Esophageal cancer Neg Hx      Objective:   Patient Vitals for the past 8 hrs:  BP Temp Temp src Pulse Resp SpO2 Height Weight  08/15/19 0749 (!) 114/53 (!) 97.3 F (36.3 C) Oral 84 18 98 % 5' (1.524 m) 61.2 kg   No intake/output data recorded. No intake/output data recorded.   See MD Preop evaluation      Assessment:   1.  Strong family history of colon cancer.  Sister had colon cancer at age 70. 2.  Extremely long and tortuous colon by both barium enema and virtual colonoscopy with prior attempted colonoscopy unsuccessful  Plan:   We will proceed with colonoscopy utilizing propofol sedation and the small bowel enteroscope which is much longer.  We have discussed the procedure including risk and benefits with the patient and the possibility that even with these measures we may not be able to reach the cecum.

## 2019-08-15 NOTE — Discharge Instructions (Signed)

## 2019-08-15 NOTE — Anesthesia Postprocedure Evaluation (Signed)
Anesthesia Post Note  Patient: Morgan Johnston  Procedure(s) Performed: COLONOSCOPY WITH PROPOFOL (N/A )     Patient location during evaluation: Endoscopy Anesthesia Type: MAC Level of consciousness: awake and alert Pain management: pain level controlled Vital Signs Assessment: post-procedure vital signs reviewed and stable Respiratory status: spontaneous breathing, nonlabored ventilation, respiratory function stable and patient connected to nasal cannula oxygen Cardiovascular status: stable and blood pressure returned to baseline Postop Assessment: no apparent nausea or vomiting Anesthetic complications: no    Last Vitals:  Vitals:   08/15/19 0930 08/15/19 0940  BP: 108/68 105/69  Pulse: 73 60  Resp: 15 13  Temp:    SpO2: 100% 100%    Last Pain:  Vitals:   08/15/19 0940  TempSrc:   PainSc: 0-No pain                 Dorothyann Mourer P Grisell Bissette

## 2019-08-22 ENCOUNTER — Other Ambulatory Visit: Payer: Self-pay | Admitting: Gastroenterology

## 2019-08-22 DIAGNOSIS — Z800 Family history of malignant neoplasm of digestive organs: Secondary | ICD-10-CM

## 2019-12-04 ENCOUNTER — Other Ambulatory Visit: Payer: Self-pay

## 2019-12-04 ENCOUNTER — Ambulatory Visit: Payer: BC Managed Care – PPO | Attending: Internal Medicine

## 2019-12-04 DIAGNOSIS — Z20822 Contact with and (suspected) exposure to covid-19: Secondary | ICD-10-CM

## 2019-12-06 LAB — NOVEL CORONAVIRUS, NAA: SARS-CoV-2, NAA: NOT DETECTED

## 2020-01-28 ENCOUNTER — Other Ambulatory Visit: Payer: Self-pay | Admitting: Family Medicine

## 2020-01-28 DIAGNOSIS — M8580 Other specified disorders of bone density and structure, unspecified site: Secondary | ICD-10-CM

## 2020-04-03 ENCOUNTER — Emergency Department (HOSPITAL_COMMUNITY)
Admission: EM | Admit: 2020-04-03 | Discharge: 2020-04-04 | Disposition: A | Discharge status: Home / Self Care | Payer: BC Managed Care – PPO | Attending: Emergency Medicine | Admitting: Emergency Medicine

## 2020-04-03 ENCOUNTER — Other Ambulatory Visit: Payer: Self-pay

## 2020-04-03 ENCOUNTER — Ambulatory Visit
Admission: RE | Admit: 2020-04-03 | Discharge: 2020-04-03 | Disposition: A | Discharge status: Home / Self Care | Payer: BC Managed Care – PPO | Source: Ambulatory Visit | Attending: Family Medicine | Admitting: Family Medicine

## 2020-04-03 ENCOUNTER — Encounter (HOSPITAL_COMMUNITY): Payer: Self-pay | Admitting: Emergency Medicine

## 2020-04-03 ENCOUNTER — Emergency Department (HOSPITAL_COMMUNITY): Payer: BC Managed Care – PPO

## 2020-04-03 DIAGNOSIS — M8580 Other specified disorders of bone density and structure, unspecified site: Secondary | ICD-10-CM

## 2020-04-03 DIAGNOSIS — R200 Anesthesia of skin: Secondary | ICD-10-CM | POA: Insufficient documentation

## 2020-04-03 DIAGNOSIS — Z5321 Procedure and treatment not carried out due to patient leaving prior to being seen by health care provider: Secondary | ICD-10-CM | POA: Insufficient documentation

## 2020-04-03 DIAGNOSIS — M79602 Pain in left arm: Secondary | ICD-10-CM | POA: Insufficient documentation

## 2020-04-03 LAB — TROPONIN I (HIGH SENSITIVITY)
Troponin I (High Sensitivity): <2 ng/L (ref <18)
Troponin I (High Sensitivity): <2 ng/L (ref <18)

## 2020-04-03 LAB — CBC
HCT: 40.7 % (ref 36.0–46.0)
Hemoglobin: 13 g/dL (ref 12.0–15.0)
MCH: 28 pg (ref 26.0–34.0)
MCHC: 31.9 g/dL (ref 30.0–36.0)
MCV: 87.7 fL (ref 80.0–100.0)
Platelets: 239 10*3/uL (ref 150–400)
RBC: 4.64 MIL/uL (ref 3.87–5.11)
RDW: 14.1 % (ref 11.5–15.5)
WBC: 6.7 10*3/uL (ref 4.0–10.5)
nRBC: 0 % (ref 0.0–0.2)

## 2020-04-03 LAB — BASIC METABOLIC PANEL
Anion gap: 9 (ref 5–15)
BUN: 20 mg/dL (ref 8–23)
CO2: 25 mmol/L (ref 22–32)
Calcium: 10.1 mg/dL (ref 8.9–10.3)
Chloride: 105 mmol/L (ref 98–111)
Creatinine, Ser: 0.73 mg/dL (ref 0.44–1.00)
GFR calc Af Amer: >60 mL/min (ref >60)
GFR calc non Af Amer: >60 mL/min (ref >60)
Glucose, Bld: 91 mg/dL (ref 70–99)
Potassium: 4.3 mmol/L (ref 3.5–5.1)
Sodium: 139 mmol/L (ref 135–145)

## 2020-04-03 MED ORDER — SODIUM CHLORIDE 0.9% FLUSH: 3.0000 mL | Freq: Once | INTRAVENOUS | Status: DC

## 2020-04-03 NOTE — ED Triage Notes (Signed)
Pt states she is been having left arm pain and numbness for the past 2 hours not getting any relief. Pt states she normally has problems on her neck, but today the pain is extreme on her left arm and jaw. No SOB, no dizziness, nausea or vomiting.

## 2020-04-04 NOTE — ED Notes (Signed)
Pt did not respond to vitals call x 3

## 2020-04-04 NOTE — ED Notes (Signed)
Called for vitals x1 

## 2020-04-23 ENCOUNTER — Ambulatory Visit: Payer: Self-pay | Admitting: Orthopedic Surgery

## 2020-05-06 ENCOUNTER — Ambulatory Visit (INDEPENDENT_AMBULATORY_CARE_PROVIDER_SITE_OTHER): Payer: BC Managed Care – PPO | Admitting: Otolaryngology

## 2020-05-06 ENCOUNTER — Other Ambulatory Visit: Payer: Self-pay

## 2020-05-06 ENCOUNTER — Encounter (INDEPENDENT_AMBULATORY_CARE_PROVIDER_SITE_OTHER): Payer: Self-pay | Admitting: Otolaryngology

## 2020-05-06 VITALS — Temp 97.5°F

## 2020-05-06 DIAGNOSIS — J310 Chronic rhinitis: Secondary | ICD-10-CM | POA: Diagnosis not present

## 2020-05-06 DIAGNOSIS — M5030 Other cervical disc degeneration, unspecified cervical region: Secondary | ICD-10-CM

## 2020-05-06 NOTE — Progress Notes (Signed)
HPI: Morgan Johnston is a 64 y.o. female who presents is referred by Dr. Rolena Infante for evaluation of vocal cord mobility prior to surgery on cervical disc.  Patient has had previous anterior cervical fusion surgery on C4-5 and C5-6 using the left neck approach over 5 years ago.  More recently has been having problems with the left arm in the C7 dermatome with degenerative disease and C6-7.  She has tried injections without adequate benefit and is scheduled for repeat anterior cervical fusion surgery. 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  She had no voice problems following her initial surgery.  Past Medical History:  Diagnosis Date  . Arthritis   . Asthma    CAUSED BY ACID REFLUX  . Back pain    DDD  . Bronchitis 2012  . Constipation   . Dizziness    r/t menstrual cycle;was given Meclizine and no problems in over a yr  . Headache(784.0)    occasionally  . Hx of seasonal allergies    takes Allegra daily  . Hyperlipidemia    takes Simvastatin nightly  . Insomnia    d/t neck pain and menopause  . Joint pain   . Joint swelling   . Pneumonia 2012   Past Surgical History:  Procedure Laterality Date  . ANTERIOR CERVICAL DECOMP/DISCECTOMY FUSION  06/07/2012   Procedure: ANTERIOR CERVICAL DECOMPRESSION/DISCECTOMY FUSION 2 LEVELS;  Surgeon: Melina Schools, MD;  Location: [facility];  Service: Orthopedics;  Laterality: N/A;  ACDF C4-5, 5-6  . CARPAL TUNNEL RELEASE     bilateral  . COLONOSCOPY    . COLONOSCOPY WITH PROPOFOL N/A 08/15/2019   Procedure: COLONOSCOPY WITH PROPOFOL;  Surgeon: Laurence Spates, MD;  Location: [facility];  Service: Endoscopy;  Laterality: N/A;  . EYE SURGERY Bilateral    CATARACT SURGERY  . 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 JOINT REPLACEMENT  2019   LEFT HAND [place] JOINT REPLACMENT  . LEFT HAND CYST REMOVED  2019  . TONSILLECTOMY     at age 50   Social History   Socioeconomic  History  . Marital status: Married    Spouse name: Not on file  . Number of children: 2  . Years of education: Not on file  . Highest education level: Not on file  Occupational History  . Occupation: Scientist, water quality: [employer]  Tobacco Use  . Smoking status: Never Smoker  . Smokeless tobacco: Never Used  Substance and Sexual Activity  . Alcohol use: Yes    Comment: RARE Q MONTH  . Drug use: No  . Sexual activity: Yes    Birth control/protection: Post-menopausal  Other Topics Concern  . Not on file  Social History Narrative  . Not on file   Social Determinants of Health   Financial Resource Strain:   . Difficulty of Paying Living Expenses:   Food Insecurity:   . Worried About Charity fundraiser in the Last Year:   . Arboriculturist in the Last Year:   Transportation Needs:   . Film/video editor (Medical):   Marland Kitchen Lack of Transportation (Non-Medical):   Physical Activity:   . Days of Exercise per Week:   . Minutes of Exercise per Session:   Stress:   . Feeling of Stress :   Social Connections:   . Frequency of Communication with Friends and Family:   .  Frequency of Social Gatherings with Friends and Family:   . Attends Religious Services:   . Active Member of Clubs or Organizations:   . Attends Archivist Meetings:   Marland Kitchen Marital Status:    Family History  Problem Relation Age of Onset  . Colon polyps Father   . Diabetes Father   . Kidney disease Father 36       Renal Cell carcinoma  . Hypertension Father   . High Cholesterol Father   . Diabetes Mother   . Allergic rhinitis Mother   . Asthma Mother   . Hypertension Mother   . High Cholesterol Mother   . Colon cancer Sister   . Diabetes Brother   . Allergic rhinitis Brother   . Esophageal cancer Neg Hx    Allergies  Allergen Reactions  . Augmentin [Amoxicillin-Pot Clavulanate] Anaphylaxis    Pt gets a pre-anaphylaxis reaction  . Codeine Other (See Comments)    REACTION:  dizziness/nausea  . Hydrocodone   . Oxycodone    Prior to Admission medications   Medication Sig Start Date End Date Taking? Authorizing Provider  albuterol (PROAIR HFA) 108 (90 Base) MCG/ACT inhaler Inhale two puffs every 4-6 hours if needed for cough or wheeze 01/09/17  Yes Kozlow, Donnamarie Poag, MD  Calcium Carbonate (CALCIUM 600 PO) Take 600 mg by mouth daily.    Yes [provider]  fexofenadine (ALLEGRA) 180 MG tablet Take 180 mg by mouth daily.   Yes [provider]  ibandronate (BONIVA) 150 MG tablet Take 150 mg by mouth every 30 (thirty) days.  02/13/14  Yes [provider]  omeprazole (PRILOSEC) 40 MG capsule TAKE 1 CAPSULE (40 MG TOTAL) BY MOUTH 2 (TWO) TIMES DAILY. Patient taking differently: Take 40 mg by mouth at bedtime.  02/06/17  Yes Kozlow, Donnamarie Poag, MD  paroxetine mesylate (PEXEVA) 10 MG tablet Take 10 mg by mouth at bedtime.   Yes [provider]  Probiotic Product (PROBIOTIC DAILY PO) Take 1 capsule by mouth daily.    Yes [provider]  simvastatin (ZOCOR) 20 MG tablet Take 20 mg by mouth every evening. 01/09/15  Yes [provider]  VITAMIN D PO Take 3,000 Units by mouth daily.    Yes [provider]     Positive ROS: Otherwise negative  All other systems have been reviewed and were otherwise negative with the exception of those mentioned in the HPI and as above.  Physical Exam: Constitutional: Alert, well-appearing, no acute distress.  She has normal voice. Ears: External ears without lesions or tenderness. Ear canals are clear bilaterally with intact, clear TMs.  Nasal: External nose without lesions. Septum is mildly deviated to the right.  She has mild rhinitis.. Clear nasal passages otherwise. Fiberoptic laryngoscopy was performed to the left nostril.  The nasopharynx was clear.  Base of tongue, vallecula and epiglottis were normal.  Vocal cords were clear bilaterally with normal vocal cord mobility  bilaterally. Oral: Lips and gums without lesions. Tongue and palate mucosa without lesions. Posterior oropharynx clear. Neck: No palpable adenopathy or masses Respiratory: Breathing comfortably  Skin: No facial/neck lesions or rash noted.  Laryngoscopy  Date/Time: 05/06/2020 9:39 AM Performed by: Rozetta Nunnery, MD Authorized by: Rozetta Nunnery, MD   Consent:    Consent obtained:  Verbal   Consent given by:  Patient Procedure details:    Indications: direct visualization of the upper aerodigestive tract     Medication:  Afrin   Scope location:  left nare   Mouth:    Vallecula: normal     Base of tongue: normal     Epiglottis: normal   Throat:    True vocal cords: normal   Comments:     On fiberoptic laryngoscopy patient had normal vocal cord mobility bilaterally.  With clear bilateral vocal cords.    Assessment: Normal vocal cord mobility bilaterally. Moderate septal deviation to the right with mild rhinitis.  Plan: Reassured patient of normal vocal cord mobility and she should be cleared for surgery. Suggested use of Nasacort or Flonase for nasal congestion.  She will follow-up as needed   Radene Journey, MD   CC:

## 2020-05-19 ENCOUNTER — Ambulatory Visit: Payer: Self-pay | Admitting: Orthopedic Surgery

## 2020-05-19 NOTE — Progress Notes (Signed)
[id number] - [place], [place] - [address] [place] [address] Phone: [phone] Fax: [phone]      Your procedure is scheduled on Wednesday 05/27/2020.  Report to [facility] Main Entrance "A" at 12:00 P.M., and check in at the Admitting office.  Call this number if you have problems the morning of surgery:  [phone]  Call [phone] if you have any questions prior to your surgery date Monday-Friday 8am-4pm    Remember:  Do not eat or drink after midnight the night before your surgery   Take these medicines the morning of surgery with A SIP OF WATER: Fexofenadine (Allegra)   As of today, STOP taking any Aspirin (unless otherwise instructed by your surgeon) and Aspirin containing products, Aleve, Naproxen, Ibuprofen, Motrin, Advil, Goody's, BC's, all herbal medications, fish oil, and all vitamins.                      Do not wear jewelry, make up, or nail polish            Do not wear lotions, powders, perfumes, or deodorant.            Do not shave 48 hours prior to surgery.            Do not bring valuables to the hospital.            [facility] is not responsible for any belongings or valuables.  Do NOT Smoke (Tobacco/Vapping) or drink Alcohol 24 hours prior to your procedure  If you use a CPAP at night, you may bring all equipment for your overnight stay.   Contacts, glasses, dentures or bridgework may not be worn into surgery.      For patients admitted to the hospital, discharge time will be determined by your treatment team.   Patients discharged the day of surgery will not be allowed to drive home, and someone needs to stay with them for 24 hours.    Special instructions:   [facility]- Preparing For Surgery  Before surgery, you can play an important role. Because skin is not sterile, your skin needs to be as free of germs as possible. You can reduce the number of germs on your  skin by washing with CHG (chlorahexidine gluconate) Soap before surgery.  CHG is an antiseptic cleaner which kills germs and bonds with the skin to continue killing germs even after washing.    Oral Hygiene is also important to reduce your risk of infection.  Remember - BRUSH YOUR TEETH THE MORNING OF SURGERY WITH YOUR REGULAR TOOTHPASTE  Please do not use if you have an allergy to CHG or antibacterial soaps. If your skin becomes reddened/irritated stop using the CHG.  Do not shave (including legs and underarms) for at least 48 hours prior to first CHG shower. It is OK to shave your face.  Please follow these instructions carefully.   1. Shower the NIGHT BEFORE SURGERY and the MORNING OF SURGERY with CHG Soap.   2. If you chose to wash your hair, wash your hair first as usual with your normal shampoo.  3. After you shampoo, rinse your hair and body thoroughly to remove the shampoo.  4. Use CHG as you would any other liquid soap. You can apply CHG directly to the skin and wash gently with a scrungie or a clean washcloth.   5. Apply the CHG Soap to  your body ONLY FROM THE NECK DOWN.  Do not use on open wounds or open sores. Avoid contact with your eyes, ears, mouth and genitals (private parts). Wash Face and genitals (private parts)  with your normal soap.   6. Wash thoroughly, paying special attention to the area where your surgery will be performed.  7. Thoroughly rinse your body with warm water from the neck down.  8. DO NOT shower/wash with your normal soap after using and rinsing off the CHG Soap.  9. Pat yourself dry with a CLEAN TOWEL.  10. Wear CLEAN PAJAMAS to bed the night before surgery, wear comfortable clothes the morning of surgery  11. Place CLEAN SHEETS on your bed the night of your first shower and DO NOT SLEEP WITH PETS.   Day of Surgery:  Do not apply any deodorants/lotions.  Please wear clean clothes to the hospital/surgery center.   Remember to brush your teeth  WITH YOUR REGULAR TOOTHPASTE.   Please read over the following fact sheets that you were given.

## 2020-05-19 NOTE — H&P (Signed)
Subjective:   Patient is a 64 y.o. female presented with a history of Previous C4-6 ACDF who has had several months of neck pain and radicular left arm as well as occipital headaches. Despite self-directed exercise program and injection therapy, she continues to have neck pain, radicular arm pain and occipital head's. She is being admitted for surgical management of this condition. The indications for the procedure include Adjacent segment disease causing neck pain, occipital headaches, and radicular left arm pain despite conservative care including self-directed exercises, injection therapy, and over-the-counter medications.  Patient Active Problem List   Diagnosis Date Noted  . Mild intermittent asthma 12/04/2015  . LPRD (laryngopharyngeal reflux disease) 12/04/2015  . Allergic rhinoconjunctivitis 12/04/2015  . Family history of colon cancer 02/06/2015  . Chest pain 01/26/2015  . Dyslipidemia 01/26/2015  . GERD (gastroesophageal reflux disease) 01/26/2015  . Bloating 10/16/2014  . CN (constipation) 10/16/2014  . Redundant colon 10/16/2014   Past Medical History:  Diagnosis Date  . Arthritis   . Asthma    CAUSED BY ACID REFLUX  . Back pain    DDD  . Bronchitis 2012  . Constipation   . Dizziness    r/t menstrual cycle;was given Meclizine and no problems in over a yr  . Headache(784.0)    occasionally  . Hx of seasonal allergies    takes Allegra daily  . Hyperlipidemia    takes Simvastatin nightly  . Insomnia    d/t neck pain and menopause  . Joint pain   . Joint swelling   . Pneumonia 2012    Past Surgical History:  Procedure Laterality Date  . ANTERIOR CERVICAL DECOMP/DISCECTOMY FUSION  06/07/2012   Procedure: ANTERIOR CERVICAL DECOMPRESSION/DISCECTOMY FUSION 2 LEVELS;  Surgeon: Melina Schools, MD;  Location: [facility];  Service: Orthopedics;  Laterality: N/A;  ACDF C4-5, 5-6  . CARPAL TUNNEL RELEASE     bilateral  . COLONOSCOPY    . COLONOSCOPY WITH PROPOFOL N/A 08/15/2019    Procedure: COLONOSCOPY WITH PROPOFOL;  Surgeon: Laurence Spates, MD;  Location: [facility];  Service: Endoscopy;  Laterality: N/A;  . EYE SURGERY Bilateral    CATARACT SURGERY  . 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 JOINT REPLACEMENT  2019   LEFT HAND [place] JOINT REPLACMENT  . LEFT HAND CYST REMOVED  2019  . TONSILLECTOMY     at age 63    Current Outpatient Medications  Medication Sig Dispense Refill Last Dose  . albuterol (PROAIR HFA) 108 (90 Base) MCG/ACT inhaler Inhale two puffs every 4-6 hours if needed for cough or wheeze (Patient not taking: Reported on 05/08/2020) 1 Inhaler 1   . Calcium Carbonate (CALCIUM 600 PO) Take 600 mg by mouth daily.      . fexofenadine (ALLEGRA) 180 MG tablet Take 180 mg by mouth daily.     Marland Kitchen ibandronate (BONIVA) 150 MG tablet Take 150 mg by mouth every 30 (thirty) days.      . naproxen sodium (ALEVE) 220 MG tablet Take 220 mg by mouth 2 (two) times daily as needed (pain).     Marland Kitchen omeprazole (PRILOSEC) 40 MG capsule TAKE 1 CAPSULE (40 MG TOTAL) BY MOUTH 2 (TWO) TIMES DAILY. (Patient not taking: Reported on 05/08/2020) 60 capsule 9   . paroxetine mesylate (PEXEVA) 10 MG tablet Take 10 mg by mouth at bedtime.     . simvastatin (ZOCOR) 20 MG tablet Take 20 mg by  mouth every evening.  5   . VITAMIN D PO Take 3,000 Units by mouth daily.       No current facility-administered medications for this visit.   Allergies  Allergen Reactions  . Augmentin [Amoxicillin-Pot Clavulanate] Anaphylaxis  . Codeine Nausea Only    dizziness  . Hydrocodone   . Oxycodone     Social History   Tobacco Use  . Smoking status: Never Smoker  . Smokeless tobacco: Never Used  Substance Use Topics  . Alcohol use: Yes    Comment: RARE Q MONTH    Family History  Problem Relation Age of Onset  . Colon polyps Father   . Diabetes Father   . Kidney disease Father 80       Renal Cell carcinoma  . Hypertension  Father   . High Cholesterol Father   . Diabetes Mother   . Allergic rhinitis Mother   . Asthma Mother   . Hypertension Mother   . High Cholesterol Mother   . Colon cancer Sister   . Diabetes Brother   . Allergic rhinitis Brother   . Esophageal cancer Neg Hx     Review of Systems As stated in HPI  Objective:   Vitals:   General: Alert and oriented 3, no apparent distress  Ambulation: Normal, no assistive devices.  Non-ataxic,  Inspection: No obvious deformity  Psych: Normal mood, affect appropriate  Heart: Regular rate and rhythm, no rubs, murmurs, or gallops  Lungs: Clear auscultation bilaterally  Abdomen: Bowel sounds 4, nontender, no rebound tenderness, nondistended.  No loss of bladder or bowel control.  Neuro: She continues to have left C7 radicular dysesthesias and pain but no focal motor deficits. Neck pain persists as well does her occipital headaches. Negative Babinski test, negative Hoffman test.  Cervical MRI: completed on 03/19/20 was reviewed with the patient. It was completed at [facility]; I have independently reviewed the images as well as the radiology report. Facet arthropathy at C3-4 producing mild to moderate foraminal stenosis. No significant comp getting features C4-6 (status post ACDF). Disc osteophyte complex producing mild to moderate foraminal stenosis C6-T1. No cord signal changes. No fracture.  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  Plan will be right-sided anterior appoach.  No contraindications to right-sided anterior approach noted via ENT evaluation per the patient.  Risks and benefits of surgery were discussed with the patient. These include: Infection, bleeding, death, stroke, paralysis, ongoing or worse pain, need for additional surgery, nonunion,  leak of spinal fluid, adjacent segment degeneration requiring additional fusion surgery. Pseudoarthrosis (nonunion)requiring supplemental posterior fixation. Throat pain, swallowing difficulties, hoarseness or change in voice.   With respect to disc replacement: Additional risks include heterotopic ossification, inability to place the disc due to technical issues requiring bailout to a fusion procedure.  We have obtained preoperative medical clearance from the patient's primary care provider  I have reviewed the patient's medication list with her.  She is not on any blood thinners, not taking any aspirin.  She is taking Aleve which I have advised her to stop 7 days prior to surgery the calcium was have also advised to stop 7 days prior to the surgery.  I have also recommended that she avoid any over-the-counter anti-inflammatory medications which she did express understanding of.  We have also discussed the  post-operative recovery period to include: bathing/showering restrictions,  wound healing, activity (and driving) restrictions, medications/pain mangement.  Unfortunately, the patient has an allergy to codeine and she cannot take hydrocodone or oxycodone he states with her prior ACDF she used tramadol for pain.  We have also discussed post-operative redflags to include: signs and symptoms of postoperative infection, DVT/PE.  Patient scheduled to have aspen collar fitted following his appointment with physical therapy  Scheduled for preoperative testing [place] of patient's questions were invited and answered

## 2020-05-19 NOTE — H&P (Deleted)
  The note originally documented on this encounter has been moved the the encounter in which it belongs.  

## 2020-05-20 ENCOUNTER — Other Ambulatory Visit: Payer: Self-pay

## 2020-05-20 ENCOUNTER — Encounter (HOSPITAL_COMMUNITY): Payer: Self-pay

## 2020-05-20 ENCOUNTER — Ambulatory Visit (HOSPITAL_COMMUNITY)
Admission: RE | Admit: 2020-05-20 | Discharge: 2020-05-20 | Disposition: A | Discharge status: Home / Self Care | Payer: BC Managed Care – PPO | Source: Ambulatory Visit | Attending: Orthopedic Surgery | Admitting: Orthopedic Surgery

## 2020-05-20 ENCOUNTER — Encounter (HOSPITAL_COMMUNITY)
Admission: RE | Admit: 2020-05-20 | Discharge: 2020-05-20 | Disposition: A | Discharge status: Home / Self Care | Payer: BC Managed Care – PPO | Source: Ambulatory Visit | Attending: Orthopedic Surgery | Admitting: Orthopedic Surgery

## 2020-05-20 DIAGNOSIS — Z01818 Encounter for other preprocedural examination: Secondary | ICD-10-CM | POA: Diagnosis not present

## 2020-05-20 LAB — URINALYSIS, ROUTINE W REFLEX MICROSCOPIC
Bilirubin Urine: NEGATIVE
Glucose, UA: NEGATIVE mg/dL
Ketones, ur: NEGATIVE mg/dL
Leukocytes,Ua: NEGATIVE
Nitrite: NEGATIVE
Protein, ur: NEGATIVE mg/dL
Specific Gravity, Urine: 1.025 (ref 1.005–1.030)
pH: 5.5 (ref 5.0–8.0)

## 2020-05-20 LAB — BASIC METABOLIC PANEL
Anion gap: 8 (ref 5–15)
BUN: 15 mg/dL (ref 8–23)
CO2: 26 mmol/L (ref 22–32)
Calcium: 9.4 mg/dL (ref 8.9–10.3)
Chloride: 107 mmol/L (ref 98–111)
Creatinine, Ser: 0.73 mg/dL (ref 0.44–1.00)
GFR calc Af Amer: >60 mL/min (ref >60)
GFR calc non Af Amer: >60 mL/min (ref >60)
Glucose, Bld: 98 mg/dL (ref 70–99)
Potassium: 4.1 mmol/L (ref 3.5–5.1)
Sodium: 141 mmol/L (ref 135–145)

## 2020-05-20 LAB — APTT: aPTT: 30 seconds (ref 24–36)

## 2020-05-20 LAB — CBC
HCT: 43.5 % (ref 36.0–46.0)
Hemoglobin: 13.7 g/dL (ref 12.0–15.0)
MCH: 28.1 pg (ref 26.0–34.0)
MCHC: 31.5 g/dL (ref 30.0–36.0)
MCV: 89.1 fL (ref 80.0–100.0)
Platelets: 251 10*3/uL (ref 150–400)
RBC: 4.88 MIL/uL (ref 3.87–5.11)
RDW: 14.8 % (ref 11.5–15.5)
WBC: 5.3 10*3/uL (ref 4.0–10.5)
nRBC: 0 % (ref 0.0–0.2)

## 2020-05-20 LAB — PROTIME-INR
INR: 0.9 (ref 0.8–1.2)
Prothrombin Time: 12 seconds (ref 11.4–15.2)

## 2020-05-20 LAB — URINALYSIS, MICROSCOPIC (REFLEX)

## 2020-05-20 LAB — SURGICAL PCR SCREEN
MRSA, PCR: NEGATIVE
Staphylococcus aureus: NEGATIVE

## 2020-05-20 NOTE — Progress Notes (Addendum)
PCP - Dr. Leighton Ruff with [facility] @ [employer] Cardiologist - Saw someone (unsure of name) years ago to r/o chest pain found to be anxiety  Chest x-ray - 05/20/20 EKG - 04/06/20 Stress Test - 03/12/15 ECHO - Years ago cant remember date  Sleep Study - Denies  DM - Denies  COVID TEST- 05/25/20  Anesthesia review: yes per Dr. Valla Leaver request  Patient denies shortness of breath, fever, cough and chest pain at PAT appointment   All instructions explained to the patient, with a verbal understanding of the material. Patient agrees to go over the instructions while at home for a better understanding. Patient also instructed to self quarantine after being tested for COVID-19. The opportunity to ask questions was provided.   Sent message to Dr. Rolena Infante to inform him of UA showing rare bacteria 05/20/20 @ 1411

## 2020-05-21 NOTE — Anesthesia Preprocedure Evaluation (Addendum)
Anesthesia Evaluation  Patient identified by MRN, date of birth, ID band Patient awake    Reviewed: Allergy & Precautions, NPO status , Patient's Chart, lab work & pertinent test results  History of Anesthesia Complications Negative for: history of anesthetic complications  Airway Mallampati: II  TM Distance: >3 FB Neck ROM: Full    Dental no notable dental hx. (+)    Pulmonary asthma ,    Pulmonary exam normal        Cardiovascular negative cardio ROS Normal cardiovascular exam     Neuro/Psych Hx of ACDF negative psych ROS   GI/Hepatic Neg liver ROS, GERD  Medicated and Controlled,  Endo/Other  negative endocrine ROS  Renal/GU negative Renal ROS  negative genitourinary   Musculoskeletal  (+) Arthritis ,   Abdominal   Peds  Hematology negative hematology ROS (+)   Anesthesia Other Findings Day of surgery medications reviewed with patient.  Reproductive/Obstetrics negative OB ROS                            Anesthesia Physical Anesthesia Plan  ASA: II  Anesthesia Plan: General   Post-op Pain Management:    Induction: Intravenous  PONV Risk Score and Plan: 4 or greater and Treatment may vary due to age or medical condition, Ondansetron, Dexamethasone and Midazolam  Airway Management Planned: Oral ETT  Additional Equipment: None  Intra-op Plan:   Post-operative Plan: Extubation in OR  Informed Consent: I have reviewed the patients History and Physical, chart, labs and discussed the procedure including the risks, benefits and alternatives for the proposed anesthesia with the patient or authorized representative who has indicated his/her understanding and acceptance.     Dental advisory given  Plan Discussed with: CRNA  Anesthesia Plan Comments: (PAT note by Karoline Caldwell, PA-C: Pt had preop eval by ENT Dr. Lucia Gaskins due to history of previous ACDF via left sided approach. Per  note 05/06/20, "Reassured patient of normal vocal cord mobility and she should be cleared for surgery."  Clearance from PCP Dr. Earle Gell dated 05/04/2020 states patient is low risk for cardiovascular complications with surgery.  Copy on chart.  Preop labs reviewed, WNL.  EKG 04/03/20: Normal sinus rhythm. Rate 65. Possible Anterior infarct , age undetermined. No significant change since last tracing  CHEST - 2 VIEW 05/20/20:  COMPARISON: Chest radiograph 03/07/2016  FINDINGS: The heart size and mediastinal contours are within normal limits. The lungs are clear. No pneumothorax or pleural effusion. No acute finding in the visualized skeleton.  IMPRESSION: No acute cardiopulmonary process.  Nuclear stress 03/11/15: Overall Impression: Normal stress nuclear study.Low risk study with no evidence of ischemia on perfusion imaging. Blood pressure response was flat throughout exercise. Overall reasonable exercise time however of 10 minutes and 30 seconds.  LV Ejection Fraction: 76%. LV Wall Motion: NL LV Function; NL Wall Motion )      Anesthesia Quick Evaluation

## 2020-05-21 NOTE — Progress Notes (Signed)
Anesthesia Chart Review:  Pt had preop eval by ENT Dr. Lucia Gaskins due to history of previous ACDF via left sided approach. Per note 05/06/20, "Reassured patient of normal vocal cord mobility and she should be cleared for surgery."  Clearance from PCP Dr. Earle Gell dated 05/04/2020 states patient is low risk for cardiovascular complications with surgery.  Copy on chart.  Preop labs reviewed, WNL.  EKG 04/03/20: Normal sinus rhythm. Rate 65. Possible Anterior infarct , age undetermined. No significant change since last tracing  CHEST - 2 VIEW 05/20/20:  COMPARISON:  Chest radiograph 03/07/2016  FINDINGS: The heart size and mediastinal contours are within normal limits. The lungs are clear. No pneumothorax or pleural effusion. No acute finding in the visualized skeleton.  IMPRESSION: No acute cardiopulmonary process.  Nuclear stress 03/11/15: Overall Impression:  Normal stress nuclear study. Low risk study with no evidence of ischemia on perfusion imaging. Blood pressure response was flat throughout exercise. Overall reasonable exercise time however of 10 minutes and 30 seconds.  LV Ejection Fraction: 76%.  LV Wall Motion:  NL LV Function; NL Wall Motion  [name] [facility] Short Stay Center/Anesthesiology Phone [phone] 05/21/2020 3:00 PM

## 2020-05-25 ENCOUNTER — Other Ambulatory Visit (HOSPITAL_COMMUNITY)
Admission: RE | Admit: 2020-05-25 | Discharge: 2020-05-25 | Disposition: A | Discharge status: Home / Self Care | Payer: BC Managed Care – PPO | Source: Ambulatory Visit | Attending: Orthopedic Surgery | Admitting: Orthopedic Surgery

## 2020-05-25 DIAGNOSIS — Z01812 Encounter for preprocedural laboratory examination: Secondary | ICD-10-CM | POA: Insufficient documentation

## 2020-05-25 DIAGNOSIS — Z20822 Contact with and (suspected) exposure to covid-19: Secondary | ICD-10-CM | POA: Insufficient documentation

## 2020-05-25 LAB — SARS CORONAVIRUS 2 (TAT 6-24 HRS): SARS Coronavirus 2: NEGATIVE

## 2020-05-27 ENCOUNTER — Other Ambulatory Visit: Payer: Self-pay

## 2020-05-27 ENCOUNTER — Encounter (HOSPITAL_COMMUNITY): Payer: Self-pay | Admitting: Orthopedic Surgery

## 2020-05-27 ENCOUNTER — Ambulatory Visit (HOSPITAL_COMMUNITY): Payer: BC Managed Care – PPO | Admitting: Anesthesiology

## 2020-05-27 ENCOUNTER — Observation Stay (HOSPITAL_COMMUNITY)
Admission: RE | Admit: 2020-05-27 | Discharge: 2020-05-28 | Disposition: A | Discharge status: Home / Self Care | Payer: BC Managed Care – PPO | Source: Ambulatory Visit | Attending: Orthopedic Surgery | Admitting: Orthopedic Surgery

## 2020-05-27 ENCOUNTER — Ambulatory Visit (HOSPITAL_COMMUNITY): Payer: BC Managed Care – PPO | Admitting: Physician Assistant

## 2020-05-27 ENCOUNTER — Ambulatory Visit (HOSPITAL_COMMUNITY)
Admission: RE | Disposition: A | Discharge status: Home / Self Care | Payer: Self-pay | Source: Ambulatory Visit | Attending: Orthopedic Surgery

## 2020-05-27 ENCOUNTER — Ambulatory Visit (HOSPITAL_COMMUNITY): Payer: BC Managed Care – PPO

## 2020-05-27 DIAGNOSIS — Z885 Allergy status to narcotic agent status: Secondary | ICD-10-CM | POA: Insufficient documentation

## 2020-05-27 DIAGNOSIS — E785 Hyperlipidemia, unspecified: Secondary | ICD-10-CM | POA: Insufficient documentation

## 2020-05-27 DIAGNOSIS — Z981 Arthrodesis status: Secondary | ICD-10-CM | POA: Insufficient documentation

## 2020-05-27 DIAGNOSIS — J302 Other seasonal allergic rhinitis: Secondary | ICD-10-CM | POA: Insufficient documentation

## 2020-05-27 DIAGNOSIS — Z79899 Other long term (current) drug therapy: Secondary | ICD-10-CM | POA: Diagnosis not present

## 2020-05-27 DIAGNOSIS — M4722 Other spondylosis with radiculopathy, cervical region: Secondary | ICD-10-CM | POA: Diagnosis not present

## 2020-05-27 DIAGNOSIS — M5412 Radiculopathy, cervical region: Secondary | ICD-10-CM | POA: Diagnosis present

## 2020-05-27 DIAGNOSIS — R519 Headache, unspecified: Secondary | ICD-10-CM | POA: Diagnosis not present

## 2020-05-27 DIAGNOSIS — Z419 Encounter for procedure for purposes other than remedying health state, unspecified: Secondary | ICD-10-CM

## 2020-05-27 HISTORY — PX: ANTERIOR CERVICAL DECOMP/DISCECTOMY FUSION: SHX1161

## 2020-05-27 SURGERY — ANTERIOR CERVICAL DECOMPRESSION/DISCECTOMY FUSION 1 LEVEL
Anesthesia: General | Site: Spine Cervical

## 2020-05-27 MED ORDER — THROMBIN 20000 UNITS EX SOLR
CUTANEOUS | Status: AC
  Filled 2020-05-27: qty 20000

## 2020-05-27 MED ORDER — CHLORHEXIDINE GLUCONATE 0.12 % MT SOLN
OROMUCOSAL | Status: AC
  Administered 2020-05-27: 15 mL via OROMUCOSAL
  Filled 2020-05-27: qty 15

## 2020-05-27 MED ORDER — DEXAMETHASONE SODIUM PHOSPHATE 10 MG/ML IJ SOLN
INTRAMUSCULAR | Status: AC
  Filled 2020-05-27: qty 1

## 2020-05-27 MED ORDER — MIDAZOLAM HCL 2 MG/2ML IJ SOLN
INTRAMUSCULAR | Status: DC | PRN
  Administered 2020-05-27: 2 mg via INTRAVENOUS

## 2020-05-27 MED ORDER — SODIUM CHLORIDE 0.9 % IV SOLN: 250.0000 mL | INTRAVENOUS | Status: DC

## 2020-05-27 MED ORDER — FENTANYL CITRATE (PF) 250 MCG/5ML IJ SOLN
INTRAMUSCULAR | Status: AC
  Filled 2020-05-27: qty 5

## 2020-05-27 MED ORDER — ACETAMINOPHEN 650 MG RE SUPP: 650.0000 mg | RECTAL | Status: DC | PRN

## 2020-05-27 MED ORDER — BUPIVACAINE HCL (PF) 0.25 % IJ SOLN
INTRAMUSCULAR | Status: DC | PRN
  Administered 2020-05-27: 6 mL

## 2020-05-27 MED ORDER — MIDAZOLAM HCL 2 MG/2ML IJ SOLN
INTRAMUSCULAR | Status: AC
  Filled 2020-05-27: qty 2

## 2020-05-27 MED ORDER — LIDOCAINE 2% (20 MG/ML) 5 ML SYRINGE
INTRAMUSCULAR | Status: AC
  Filled 2020-05-27: qty 5

## 2020-05-27 MED ORDER — ONDANSETRON HCL 4 MG PO TABS: 4.0000 mg | ORAL_TABLET | Freq: Three times a day (TID) | ORAL | 0 refills | Status: DC | PRN

## 2020-05-27 MED ORDER — SODIUM CHLORIDE 0.9% FLUSH: 3.0000 mL | INTRAVENOUS | Status: DC | PRN

## 2020-05-27 MED ORDER — HYDROMORPHONE HCL 1 MG/ML IJ SOLN
INTRAMUSCULAR | Status: AC
  Filled 2020-05-27: qty 1

## 2020-05-27 MED ORDER — SODIUM CHLORIDE 0.9% FLUSH: 3.0000 mL | Freq: Two times a day (BID) | INTRAVENOUS | Status: DC

## 2020-05-27 MED ORDER — VANCOMYCIN HCL IN DEXTROSE 1-5 GM/200ML-% IV SOLN: 1000.0000 mg | Freq: Once | INTRAVENOUS | Status: DC

## 2020-05-27 MED ORDER — PAROXETINE HCL 10 MG PO TABS
10.0000 mg | ORAL_TABLET | Freq: Every day | ORAL | Status: DC
  Administered 2020-05-27: 10 mg via ORAL
  Filled 2020-05-27: qty 1

## 2020-05-27 MED ORDER — ROCURONIUM BROMIDE 10 MG/ML (PF) SYRINGE
PREFILLED_SYRINGE | INTRAVENOUS | Status: DC | PRN
  Administered 2020-05-27: 50 mg via INTRAVENOUS

## 2020-05-27 MED ORDER — VANCOMYCIN HCL IN DEXTROSE 1-5 GM/200ML-% IV SOLN
INTRAVENOUS | Status: AC
  Filled 2020-05-27: qty 200

## 2020-05-27 MED ORDER — LACTATED RINGERS IV SOLN: INTRAVENOUS | Status: DC | PRN

## 2020-05-27 MED ORDER — ACETAMINOPHEN 500 MG PO TABS: 1000.0000 mg | ORAL_TABLET | Freq: Once | ORAL | Status: AC

## 2020-05-27 MED ORDER — MENTHOL 3 MG MT LOZG: 1.0000 | LOZENGE | OROMUCOSAL | Status: DC | PRN

## 2020-05-27 MED ORDER — LIDOCAINE 2% (20 MG/ML) 5 ML SYRINGE
INTRAMUSCULAR | Status: DC | PRN
  Administered 2020-05-27: 60 mg via INTRAVENOUS

## 2020-05-27 MED ORDER — ONDANSETRON HCL 4 MG/2ML IJ SOLN
INTRAMUSCULAR | Status: DC | PRN
  Administered 2020-05-27: 4 mg via INTRAVENOUS

## 2020-05-27 MED ORDER — CHLORHEXIDINE GLUCONATE 0.12 % MT SOLN: 15.0000 mL | Freq: Once | OROMUCOSAL | Status: AC

## 2020-05-27 MED ORDER — VANCOMYCIN HCL 1000 MG IV SOLR
INTRAVENOUS | Status: DC | PRN
  Administered 2020-05-27: 1000 mg via INTRAVENOUS

## 2020-05-27 MED ORDER — EPINEPHRINE PF 1 MG/ML IJ SOLN
INTRAMUSCULAR | Status: DC | PRN
  Administered 2020-05-27: .15 mL

## 2020-05-27 MED ORDER — METHOCARBAMOL 500 MG PO TABS: 500.0000 mg | ORAL_TABLET | Freq: Three times a day (TID) | ORAL | 0 refills | Status: AC | PRN

## 2020-05-27 MED ORDER — METHOCARBAMOL 1000 MG/10ML IJ SOLN
500.0000 mg | Freq: Four times a day (QID) | INTRAVENOUS | Status: DC | PRN
  Filled 2020-05-27: qty 5

## 2020-05-27 MED ORDER — PROPOFOL 10 MG/ML IV BOLUS
INTRAVENOUS | Status: AC
  Filled 2020-05-27: qty 20

## 2020-05-27 MED ORDER — METHOCARBAMOL 500 MG PO TABS
ORAL_TABLET | ORAL | Status: AC
  Filled 2020-05-27: qty 1

## 2020-05-27 MED ORDER — LACTATED RINGERS IV SOLN: INTRAVENOUS | Status: DC

## 2020-05-27 MED ORDER — BUPIVACAINE HCL (PF) 0.25 % IJ SOLN
INTRAMUSCULAR | Status: AC
  Filled 2020-05-27: qty 30

## 2020-05-27 MED ORDER — HEMOSTATIC AGENTS (NO CHARGE) OPTIME
TOPICAL | Status: DC | PRN
  Administered 2020-05-27: 1 via TOPICAL

## 2020-05-27 MED ORDER — TRAMADOL HCL 50 MG PO TABS
50.0000 mg | ORAL_TABLET | Freq: Four times a day (QID) | ORAL | Status: DC
  Administered 2020-05-27 – 2020-05-28 (×3): 50 mg via ORAL
  Filled 2020-05-27 (×3): qty 1

## 2020-05-27 MED ORDER — PHENOL 1.4 % MT LIQD: 1.0000 | OROMUCOSAL | Status: DC | PRN

## 2020-05-27 MED ORDER — ACETAMINOPHEN 325 MG PO TABS
650.0000 mg | ORAL_TABLET | ORAL | Status: DC | PRN
  Administered 2020-05-28: 650 mg via ORAL
  Filled 2020-05-27: qty 2

## 2020-05-27 MED ORDER — HYDROMORPHONE HCL 1 MG/ML IJ SOLN
0.2500 mg | INTRAMUSCULAR | Status: DC | PRN
  Administered 2020-05-27 (×2): 0.25 mg via INTRAVENOUS

## 2020-05-27 MED ORDER — ONDANSETRON HCL 4 MG/2ML IJ SOLN
INTRAMUSCULAR | Status: AC
  Filled 2020-05-27: qty 2

## 2020-05-27 MED ORDER — PHENYLEPHRINE HCL-NACL 10-0.9 MG/250ML-% IV SOLN
INTRAVENOUS | Status: DC | PRN
  Administered 2020-05-27: 25 ug/min via INTRAVENOUS

## 2020-05-27 MED ORDER — PROMETHAZINE HCL 25 MG/ML IJ SOLN: 6.2500 mg | INTRAMUSCULAR | Status: DC | PRN

## 2020-05-27 MED ORDER — 0.9 % SODIUM CHLORIDE (POUR BTL) OPTIME
TOPICAL | Status: DC | PRN
  Administered 2020-05-27: 1000 mL

## 2020-05-27 MED ORDER — VANCOMYCIN HCL IN DEXTROSE 1-5 GM/200ML-% IV SOLN
1000.0000 mg | Freq: Once | INTRAVENOUS | Status: AC
  Administered 2020-05-27: 1000 mg via INTRAVENOUS
  Filled 2020-05-27: qty 200

## 2020-05-27 MED ORDER — TRAMADOL HCL 50 MG PO TABS: 50.0000 mg | ORAL_TABLET | Freq: Four times a day (QID) | ORAL | Status: AC | PRN

## 2020-05-27 MED ORDER — ROCURONIUM BROMIDE 10 MG/ML (PF) SYRINGE
PREFILLED_SYRINGE | INTRAVENOUS | Status: AC
  Filled 2020-05-27: qty 10

## 2020-05-27 MED ORDER — ACETAMINOPHEN 500 MG PO TABS
ORAL_TABLET | ORAL | Status: AC
  Administered 2020-05-27: 1000 mg via ORAL
  Filled 2020-05-27: qty 2

## 2020-05-27 MED ORDER — EPINEPHRINE PF 1 MG/ML IJ SOLN
INTRAMUSCULAR | Status: AC
  Filled 2020-05-27: qty 1

## 2020-05-27 MED ORDER — FENTANYL CITRATE (PF) 250 MCG/5ML IJ SOLN
INTRAMUSCULAR | Status: DC | PRN
  Administered 2020-05-27 (×2): 50 ug via INTRAVENOUS
  Administered 2020-05-27: 100 ug via INTRAVENOUS

## 2020-05-27 MED ORDER — DEXAMETHASONE SODIUM PHOSPHATE 10 MG/ML IJ SOLN
INTRAMUSCULAR | Status: DC | PRN
  Administered 2020-05-27: 10 mg via INTRAVENOUS

## 2020-05-27 MED ORDER — VANCOMYCIN HCL IN DEXTROSE 1-5 GM/200ML-% IV SOLN
1000.0000 mg | INTRAVENOUS | Status: AC
  Administered 2020-05-27: 1000 mg via INTRAVENOUS
  Filled 2020-05-27: qty 200

## 2020-05-27 MED ORDER — ONDANSETRON HCL 4 MG/2ML IJ SOLN: 4.0000 mg | Freq: Four times a day (QID) | INTRAMUSCULAR | Status: DC | PRN

## 2020-05-27 MED ORDER — PROPOFOL 10 MG/ML IV BOLUS
INTRAVENOUS | Status: DC | PRN
  Administered 2020-05-27: 120 mg via INTRAVENOUS
  Administered 2020-05-27: 20 mg via INTRAVENOUS

## 2020-05-27 MED ORDER — ORAL CARE MOUTH RINSE: 15.0000 mL | Freq: Once | OROMUCOSAL | Status: AC

## 2020-05-27 MED ORDER — METHOCARBAMOL 500 MG PO TABS
500.0000 mg | ORAL_TABLET | Freq: Four times a day (QID) | ORAL | Status: DC | PRN
  Administered 2020-05-27 – 2020-05-28 (×2): 500 mg via ORAL
  Filled 2020-05-27 (×2): qty 1

## 2020-05-27 MED ORDER — ONDANSETRON HCL 4 MG PO TABS: 4.0000 mg | ORAL_TABLET | Freq: Four times a day (QID) | ORAL | Status: DC | PRN

## 2020-05-27 SURGICAL SUPPLY — 65 items
AGENT HMST KT MTR STRL THRMB (HEMOSTASIS) ×1
BONE VIVIGEN FORMABLE 1.3CC (Bone Implant) ×3 IMPLANT
CABLE BIPOLOR RESECTION CORD (MISCELLANEOUS) ×3 IMPLANT
CANISTER SUCT 3000ML PPV (MISCELLANEOUS) ×3 IMPLANT
CLOSURE STERI-STRIP 1/2X4 (GAUZE/BANDAGES/DRESSINGS) ×1
CLSR STERI-STRIP ANTIMIC 1/2X4 (GAUZE/BANDAGES/DRESSINGS) ×2 IMPLANT
COVER MAYO STAND STRL (DRAPES) ×9 IMPLANT
COVER PLATE (Plate) ×2 IMPLANT
COVER SURGICAL LIGHT HANDLE (MISCELLANEOUS) ×6 IMPLANT
COVER WAND RF STERILE (DRAPES) ×3 IMPLANT
DEVICE FUSION NANLCK 6 DEG 6 (Screw) IMPLANT
DRAPE C-ARM 42X72 X-RAY (DRAPES) ×3 IMPLANT
DRAPE POUCH INSTRU U-SHP 10X18 (DRAPES) ×3 IMPLANT
DRAPE SURG 17X23 STRL (DRAPES) ×3 IMPLANT
DRAPE U-SHAPE 47X51 STRL (DRAPES) ×3 IMPLANT
DRSG OPSITE POSTOP 3X4 (GAUZE/BANDAGES/DRESSINGS) ×3 IMPLANT
DRSG OPSITE POSTOP 4X6 (GAUZE/BANDAGES/DRESSINGS) ×2 IMPLANT
DURAPREP 26ML APPLICATOR (WOUND CARE) ×3 IMPLANT
ELECT COATED BLADE 2.86 ST (ELECTRODE) ×3 IMPLANT
ELECT PENCIL ROCKER SW 15FT (MISCELLANEOUS) ×3 IMPLANT
ELECT REM PT RETURN 9FT ADLT (ELECTROSURGICAL) ×3
ELECTRODE REM PT RTRN 9FT ADLT (ELECTROSURGICAL) ×1 IMPLANT
FUSION TCS NANOLOCK 6 DEG 6 (Screw) ×3 IMPLANT
GLOVE BIO SURGEON STRL SZ 6.5 (GLOVE) ×2 IMPLANT
GLOVE BIO SURGEONS STRL SZ 6.5 (GLOVE) ×1
GLOVE BIOGEL PI IND STRL 6.5 (GLOVE) ×1 IMPLANT
GLOVE BIOGEL PI IND STRL 8.5 (GLOVE) ×1 IMPLANT
GLOVE BIOGEL PI INDICATOR 6.5 (GLOVE) ×2
GLOVE BIOGEL PI INDICATOR 8.5 (GLOVE) ×2
GLOVE SS BIOGEL STRL SZ 8.5 (GLOVE) ×1 IMPLANT
GLOVE SUPERSENSE BIOGEL SZ 8.5 (GLOVE) ×2
GOWN STRL REUS W/ TWL LRG LVL3 (GOWN DISPOSABLE) ×1 IMPLANT
GOWN STRL REUS W/TWL 2XL LVL3 (GOWN DISPOSABLE) ×3 IMPLANT
GOWN STRL REUS W/TWL LRG LVL3 (GOWN DISPOSABLE) ×3
GRAFT BNE MATRIX VG FRMBL SM 1 (Bone Implant) IMPLANT
KIT BASIN OR (CUSTOM PROCEDURE TRAY) ×3 IMPLANT
KIT TURNOVER KIT B (KITS) ×3 IMPLANT
NDL SPNL 18GX3.5 QUINCKE PK (NEEDLE) ×1 IMPLANT
NEEDLE HYPO 22GX1.5 SAFETY (NEEDLE) ×3 IMPLANT
NEEDLE SPNL 18GX3.5 QUINCKE PK (NEEDLE) ×3 IMPLANT
NS IRRIG 1000ML POUR BTL (IV SOLUTION) ×3 IMPLANT
PACK ORTHO CERVICAL (CUSTOM PROCEDURE TRAY) ×3 IMPLANT
PACK UNIVERSAL I (CUSTOM PROCEDURE TRAY) ×3 IMPLANT
PAD ARMBOARD 7.5X6 YLW CONV (MISCELLANEOUS) ×6 IMPLANT
PATTIES SURGICAL .25X.25 (GAUZE/BANDAGES/DRESSINGS) IMPLANT
PLATE LOCK ENDO TCS (Plate) ×1 IMPLANT
PLATE LOCK ENDO TCS F/COVER (Plate) IMPLANT
POSITIONER HEAD DONUT 9IN (MISCELLANEOUS) ×3 IMPLANT
RESTRAINT LIMB HOLDER UNIV (RESTRAINTS) ×3 IMPLANT
SCREW ENDO BONE 3.8X14MM (Screw) ×4 IMPLANT
SPONGE INTESTINAL PEANUT (DISPOSABLE) ×3 IMPLANT
SPONGE SURGIFOAM ABS GEL SZ50 (HEMOSTASIS) ×3 IMPLANT
SURGIFLO W/THROMBIN 8M KIT (HEMOSTASIS) ×2 IMPLANT
SUT BONE WAX W31G (SUTURE) ×3 IMPLANT
SUT MNCRL AB 3-0 PS2 27 (SUTURE) ×3 IMPLANT
SUT SILK 2 0 TIES 10X30 (SUTURE) ×2 IMPLANT
SUT VIC AB 2-0 CT1 18 (SUTURE) ×3 IMPLANT
SYR BULB IRRIG 60ML STRL (SYRINGE) ×3 IMPLANT
SYR CONTROL 10ML LL (SYRINGE) ×3 IMPLANT
TAPE CLOTH 4X10 WHT NS (GAUZE/BANDAGES/DRESSINGS) ×3 IMPLANT
TAPE UMBILICAL COTTON 1/8X30 (MISCELLANEOUS) ×3 IMPLANT
THROMBIN 20,000 UNITS IMPLANT
TOWEL GREEN STERILE (TOWEL DISPOSABLE) ×3 IMPLANT
TOWEL GREEN STERILE FF (TOWEL DISPOSABLE) ×3 IMPLANT
WATER STERILE IRR 1000ML POUR (IV SOLUTION) ×3 IMPLANT

## 2020-05-27 NOTE — Transfer of Care (Signed)
Immediate Anesthesia Transfer of Care Note  Patient: Morgan Johnston  Procedure(s) Performed: ANTERIOR CERVICAL DECOMPRESSION/DISCECTOMY FUSION CERVICAL SIX-SEVEN (N/A Spine Cervical)  Patient Location: PACU  Anesthesia Type:General  Level of Consciousness: drowsy and patient cooperative  Airway & Oxygen Therapy: Patient Spontanous Breathing and Patient connected to nasal cannula oxygen  Post-op Assessment: Report given to RN, Post -op Vital signs reviewed and stable and Patient moving all extremities  Post vital signs: Reviewed and stable  Last Vitals:  Vitals Value Taken Time  BP 139/79 05/27/20 1630  Temp 36.6 C 05/27/20 1630  Pulse 103 05/27/20 1630  Resp 21 05/27/20 1630  SpO2 98 % 05/27/20 1630  Vitals shown include unvalidated device data.  Last Pain:  Vitals:   05/27/20 1242  PainSc: 0-No pain         Complications: No apparent anesthesia complications

## 2020-05-27 NOTE — Discharge Instructions (Signed)

## 2020-05-27 NOTE — Brief Op Note (Signed)
05/27/2020  4:38 PM  PATIENT:  Morgan Johnston  64 y.o. 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

## 2020-05-27 NOTE — Op Note (Signed)
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  Prior ACDF C4-6.  Postoperative diagnosis: Same  Operative procedure: ACDF D9-2  Complications: None  Implants: Medtronic/Titan 0 profile intervertebral spacer.  6 mm lordotic small cage.  14 mm locking screws  Allograft:vivogen  First assistant: Cleta Alberts, PA  Indications: Morgan Johnston is a very pleasant 64 year old woman who presents with recurrent neck and arm pain several years after having a two-level ACDF.  Imaging studies confirmed adjacent segment disease at the C6-7 level.  Clinical exam is consistent with C7 radicular arm pain.  Attempts at conservative management had failed to alleviate her symptoms.  Because of the continued decline in her quality of life she elected to proceed with surgery.  All appropriate risks benefits and alternatives were discussed with the patient and consent was obtained.  Operative report: Patient was brought the operating room placed upon the operating room table.  After successful induction of general anesthesia and endotracheal intubation teds SCDs were applied and the anterior cervical spine was prepped and draped in standard fashion.  Timeout was taken to confirm patient procedure and all other important data.    Lateral fluoroscopy imaging was used to identify the the C6-7 level.  I marked out the incision site and infiltrated with quarter percent Marcaine with epinephrine.  Transverse incision was made and sharp dissection was carried out down to the platysma.  The platysma was sharply incised and then identified the sternocleidomastoid.  I began dissecting along the medial border into the deep cervical and prevertebral fascia.  I was able to palpate the inferior aspect of the cervical plate.  I swept the esophagus and trachea off to the left and then placed a self-retaining retractor protected.  The carotid artery was identified and protected with a  finger.  Using Kitner dissectors I continue to dissect the prevertebral fascia until I exposed the entire C6-C7 disc space.  Self-retaining retractor was placed underneath the longus coli muscle and I deflated the endotracheal cuff and expanded the retractor.  The endotracheal cuff was then reexpanded.  A needle was placed into the C6-7 disc space and x-ray was used to confirm the level.  An annulotomy was performed with a 15 blade scalpel and I remove the bulk of the disc material with pituitary rongeurs, curettes, and Kerrison rongeurs.  I remove the overhanging osteophyte from the inferior aspect of the C6 vertebral body.  I remove the right locking screw from the cervical plate at C6 in order to place my distraction pin.  Distraction pin was also placed into the body of C7 and distracted the intervertebral space and continued using my curettes to remove the disc material posteriorly.  A fine nerve hook was used to dissect underneath the annulus and I remove the posterior annulus with a 1 mm Kerrison.  This allowed me to undercut the uncovertebral joint and adequately decompressed the area.  At this point with the discectomy complete I confirmed that I was behind the posterior aspect of the vertebral bodies with my nerve hook and lateral fluoroscopy.  At this point I was pleased with the overall decompression.  I trialed the intervertebral spacers and then obtained a size 6 small lordotic cage and packed it with the allograft.  It was then [facility] to the appropriate depth.  An awl was used to broach the cortex and a single locking screw was placed through the cage and into the C6 vertebral body on the right side  and a single locking screw was placed through the cage and into the left side of C7.  Both screws had excellent purchase.  The anterior locking plate was then secured over the cage.  The wound was now copiously irrigated with normal saline and I confirmed hemostasis using bipolar electrocautery. I  removed all the retractors and returned the esophagus and trachea to midline.  The platysma was closed with interrupted 2-0 Vicryl suture and the skin with 3-0 Monocryl.  Steri-Strips and a dry dressing were applied and the patient was extubated transfer the PACU without incident.  End of the case all needle sponge counts were correct.  There were no adverse intraoperative events

## 2020-05-27 NOTE — Anesthesia Postprocedure Evaluation (Signed)
Anesthesia Post Note  Patient: Morgan Johnston  Procedure(s) Performed: ANTERIOR CERVICAL DECOMPRESSION/DISCECTOMY FUSION CERVICAL SIX-SEVEN (N/A Spine Cervical)     Patient location during evaluation: PACU Anesthesia Type: General Level of consciousness: awake and alert Pain management: pain level controlled Vital Signs Assessment: post-procedure vital signs reviewed and stable Respiratory status: spontaneous breathing, nonlabored ventilation and respiratory function stable Cardiovascular status: blood pressure returned to baseline and stable Postop Assessment: no apparent nausea or vomiting Anesthetic complications: no    Last Vitals:  Vitals:   05/27/20 1715 05/27/20 1730  BP: 125/70 127/72  Pulse: 92 89  Resp: 18 17  Temp:    SpO2: 95% 96%    Last Pain:  Vitals:   05/27/20 1730  PainSc: 2         RLE Motor Response: Purposeful movement (05/27/20 1730) RLE Sensation: Full sensation (05/27/20 1730)      Yailin Biederman A.

## 2020-05-27 NOTE — Anesthesia Procedure Notes (Signed)
Procedure Name: Intubation Date/Time: 05/27/2020 2:23 PM Performed by: Michele Rockers, CRNA Pre-anesthesia Checklist: Patient identified, Patient being monitored, Emergency Drugs available and Suction available Patient Re-evaluated:Patient Re-evaluated prior to induction Oxygen Delivery Method: Circle System Utilized Preoxygenation: Pre-oxygenation with 100% oxygen Induction Type: IV induction Ventilation: Mask ventilation without difficulty and Oral airway inserted - appropriate to patient size Laryngoscope Size: Miller and 3 Grade View: Grade I Tube type: Oral Tube size: 7.5 mm Number of attempts: 1 Airway Equipment and Method: Stylet Placement Confirmation: ETT inserted through vocal cords under direct vision,  positive ETCO2 and breath sounds checked- equal and bilateral Secured at: 21 cm Tube secured with: Tape Dental Injury: Teeth and Oropharynx as per pre-operative assessment

## 2020-05-27 NOTE — Progress Notes (Addendum)
Pharmacy Antibiotic Note  Morgan Johnston is a 64 y.o. female admitted on 05/27/2020 with cervical radiculopathy, adjacent segment degenerative disc disease.  She is S/P cervical decompression/discectomy and fusion surgery this afternoon. Pharmacy has been consulted for vancomycin dosing for post-op surgical prophylaxis.  Per surgeon note, pt has no drains in place post op. She rec'd vancomycin 1 gm IV X 1 pre op at 13:05 PM today.  WBC 5.3, afebrile, Scr 0.73, CrCl 58.6 ml/min (renal function stable)  Plan: Vancomycin 1 gm IV X 1 at 0100 on 05/28/20 (~12 hrs after pre-op vancomycin dose)  Height: 5' (152.4 cm) Weight: 60.8 kg (134 lb) IBW/kg (Calculated) : 45.5  Temp (24hrs), Avg:98.1 F (36.7 C), Min:97.3 F (36.3 C), Max:98.9 F (37.2 C)  Estimated Creatinine Clearance: 58.6 mL/min (by C-G formula based on SCr of 0.73 mg/dL).    Allergies  Allergen Reactions  . Augmentin [Amoxicillin-Pot Clavulanate] Anaphylaxis  . Codeine Nausea Only    dizziness  . Hydrocodone   . Oxycodone     Microbiology results: 6/2 MRSA PCR: negative 6/7 COVID: negative  Thank you for allowing pharmacy to be a part of this patient's care.  Gillermina Hu, PharmD, BCPS, [facility] Clinical Pharmacist 05/27/2020 6:56 PM

## 2020-05-27 NOTE — H&P (Signed)
Addendum dictation  Morgan Johnston presents today for definitive treatment of her neck and radicular arm pain.  Despite appropriate conservative management her overall quality of life is continued to deteriorate.  She has had a previous C4 6 ACDF and now has adjacent segment C6-7 pain and radicular left arm pain.  Patient has been seen preoperatively by ENT specialist and there is no contraindication to a right sided approach.  Surgical plan is adjacent segment ACDF at C6-7.  I have reviewed the risks benefits and alternatives to surgery with the patient and she is expressed understanding and willingness to move forward.  There is been no change in her clinical exam since her last office visit of 05/19/20

## 2020-05-28 ENCOUNTER — Encounter (HOSPITAL_COMMUNITY): Payer: Self-pay | Admitting: Orthopedic Surgery

## 2020-05-28 DIAGNOSIS — M4722 Other spondylosis with radiculopathy, cervical region: Secondary | ICD-10-CM | POA: Diagnosis not present

## 2020-05-28 MED ORDER — TRAMADOL HCL 50 MG PO TABS: 50.0000 mg | ORAL_TABLET | Freq: Four times a day (QID) | ORAL | 0 refills | Status: AC | PRN

## 2020-05-28 MED FILL — Thrombin For Soln 20000 Unit: CUTANEOUS | Qty: 1 | Status: AC

## 2020-05-28 NOTE — Evaluation (Signed)
Physical Therapy Evaluation & Discharge Patient Details Name: Morgan Johnston MRN: [id number] DOB: 26-Mar-1956 Today's Date: 05/28/2020   History of Present Illness  Pt is a 64 y.o. female s/p C6-7 ACDF. PMH includes s/pC4-5 ACDF,   Clinical Impression  Pt exiting bathroom upon arrival and willing to participate in therapy session. Prior to admission, pt reported that she was independent with all functional mobility and ADLs. Pt lives with her spouse in a two level home with a level entry. Her bedroom/bathroom are on the main level. At the time of evaluation, pt overall moving very well without the need for physical assistance. She tolerated hallway ambulation without an AD and participated in stair training without difficulties. PT provided pt education re: cervical precautions with handout provided, car transfers and a generalized walking program for pt to initiate upon d/c home. Pt expressed understanding. No further acute PT needs identified at this time. PT signing off.     Follow Up Recommendations No PT follow up    Equipment Recommendations  None recommended by PT    Recommendations for Other Services       Precautions / Restrictions Precautions Precautions: Cervical Precaution Booklet Issued: Yes (comment) Precaution Comments: Pt able to state precautions independently  Required Braces or Orthoses: Cervical Brace Cervical Brace: Hard collar Restrictions Weight Bearing Restrictions: No      Mobility  Bed Mobility Overal bed mobility: Modified Independent             General bed mobility comments: pt OOB in bathroom upon arrival  Transfers Overall transfer level: Modified independent Equipment used: None                Ambulation/Gait Ambulation/Gait assistance: Supervision Gait Distance (Feet): 500 Feet Assistive device: None Gait Pattern/deviations: Step-through pattern Gait velocity: decreased   General Gait Details: pt steady overall without use of an  AD, no LOB or need for physical assistance  Stairs Stairs: Yes Stairs assistance: Supervision Stair Management: One rail Left;Step to pattern;Forwards Number of Stairs: 4 General stair comments: no instability or LOB  Wheelchair Mobility    Modified Rankin (Stroke Patients Only)       Balance Overall balance assessment: No apparent balance deficits (not formally assessed)                                           Pertinent Vitals/Pain Pain Assessment: 0-10 Pain Score: 2  Faces Pain Scale: Hurts a little bit Pain Location: neck/throat Pain Descriptors / Indicators: Sore Pain Intervention(s): Monitored during session;Repositioned    Home Living Family/patient expects to be discharged to:: Private residence Living Arrangements: Spouse/significant other Available Help at Discharge: Family;Available 24 hours/day Type of Home: House Home Access: Level entry     Home Layout: Able to live on main level with bedroom/bathroom Home Equipment: None      Prior Function Level of Independence: Independent         Comments: Pt is an Development worker, 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   Dominant Hand: Right    Extremity/Trunk Assessment   Upper Extremity Assessment Upper Extremity Assessment: Overall WFL for tasks assessed    Lower Extremity Assessment Lower Extremity Assessment: Overall WFL for tasks assessed    Cervical / Trunk Assessment Cervical / Trunk Assessment: Other exceptions Cervical / Trunk Exceptions: s/p cervical sx  Communication   Communication: No difficulties  Cognition Arousal/Alertness: Awake/alert Behavior During Therapy: WFL for tasks assessed/performed Overall Cognitive Status: Within Functional Limits for tasks assessed                                        General Comments      Exercises     Assessment/Plan    PT Assessment Patent does not need any further PT  services  PT Problem List         PT Treatment Interventions      PT Goals (Current goals can be found in the Care Plan section)  Acute Rehab PT Goals Patient Stated Goal: to go home and eventually back to work  PT Goal Formulation: All assessment and education complete, DC therapy    Frequency     Barriers to discharge        Co-evaluation               AM-PAC PT "6 Clicks" Mobility  Outcome Measure Help needed turning from your back to your side while in a flat bed without using bedrails?: None Help needed moving from lying on your back to sitting on the side of a flat bed without using bedrails?: None Help needed moving to and from a bed to a chair (including a wheelchair)?: None Help needed standing up from a chair using your arms (e.g., wheelchair or bedside chair)?: None Help needed to walk in hospital room?: None Help needed climbing 3-5 steps with a railing? : None 6 Click Score: 24    End of Session Equipment Utilized During Treatment: Cervical collar Activity Tolerance: Patient tolerated treatment well Patient left: in bed;with call bell/phone within reach;Other (comment) (seated EOB) Nurse Communication: Mobility status PT Visit Diagnosis: Other abnormalities of gait and mobility (R26.89)    Time: 9937-1696 PT Time Calculation (min) (ACUTE ONLY): 14 min   Charges:   PT Evaluation $PT Eval Low Complexity: 1 Low          Eduard Clos, PT, DPT  Acute Rehabilitation Services Pager [phone] Office [place] 05/28/2020, 10:23 AM

## 2020-05-28 NOTE — Evaluation (Signed)
Occupational Therapy Evaluation Patient Details Name: Morgan Johnston MRN: [id number] DOB: May 29, 1956 Today's Date: 05/28/2020    History of Present Illness This 64 y.o. female admitted for C6-7 ACDF. PMH includes s/pC4-5 ACDF,    Clinical Impression   Patient evaluated by Occupational Therapy with no further acute OT needs identified. All education has been completed and the patient has no further questions. Pt demonstrates good understanding of precautions and is able to perform ADLs with set up/supervision.  She was instructed in use of reacher, and has good support from spouse.  See below for any follow-up Occupational Therapy or equipment needs. OT is signing off. Thank you for this referral.     Follow Up Recommendations  No OT follow up;Supervision - Intermittent    Equipment Recommendations  None recommended by OT    Recommendations for Other Services       Precautions / Restrictions Precautions Precautions: Cervical Precaution Booklet Issued: Yes (comment) Precaution Comments: Pt able to state precautions independently  Required Braces or Orthoses: Cervical Brace Cervical Brace: Hard collar;Other (comment) (may have collar off for sleep, when showering) Restrictions Weight Bearing Restrictions: No      Mobility Bed Mobility Overal bed mobility: Modified Independent             General bed mobility comments: HOB elevated - she has an adjustable bed   Transfers Overall transfer level: Modified independent                    Balance Overall balance assessment: No apparent balance deficits (not formally assessed)                                         ADL either performed or assessed with clinical judgement   ADL Overall ADL's : Needs assistance/impaired Eating/Feeding: Independent;Sitting   Grooming: Wash/dry hands;Oral care;Wash/dry face;Brushing hair;Supervision/safety;Standing   Upper Body Bathing: Supervision/  safety;Sitting;Standing   Lower Body Bathing: Supervison/ safety;Sit to/from stand Lower Body Bathing Details (indicate cue type and reason): able to perform figure 4  Upper Body Dressing : Supervision/safety;Set up;Sitting   Lower Body Dressing: Supervision/safety;Set up;Sit to/from stand Lower Body Dressing Details (indicate cue type and reason): able to perform figure 4  Toilet Transfer: Supervision/safety;Ambulation;Comfort height toilet;Grab bars   Toileting- Clothing Manipulation and Hygiene: Supervision/safety;Sit to/from stand   Tub/ Shower Transfer: Walk-in shower;Supervision/safety   Functional mobility during ADLs: Supervision/safety General ADL Comments: Pt demonstrates good compliance of precautions.  She was able to verbalize understanding of collar management.  Discussed safety with simple IADLs, and ergonomics with work related activities when she is cleared to return to work      Vision Baseline Vision/History: No visual deficits Patient Visual Report: No change from baseline       Perception     Praxis      Pertinent Vitals/Pain Pain Assessment: Faces Faces Pain Scale: Hurts a little bit Pain Location: neck  Pain Descriptors / Indicators: Operative site guarding Pain Intervention(s): Monitored during session     Hand Dominance Right   Extremity/Trunk Assessment Upper Extremity Assessment Upper Extremity Assessment: Overall WFL for tasks assessed   Lower Extremity Assessment Lower Extremity Assessment: Overall WFL for tasks assessed       Communication Communication Communication: No difficulties   Cognition Arousal/Alertness: Awake/alert Behavior During Therapy: WFL for tasks assessed/performed Overall Cognitive Status: Within Functional Limits for tasks assessed  General Comments       Exercises     Shoulder Instructions      Home Living Family/patient expects to be discharged to::  Private residence Living Arrangements: Spouse/significant other Available Help at Discharge: Family;Available 24 hours/day Type of Home: House       Home Layout: Able to live on main level with bedroom/bathroom     Bathroom Shower/Tub: Occupational psychologist: Standard     Home Equipment: None          Prior Functioning/Environment Level of Independence: Independent        Comments: Pt is an Development worker, 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, museum         OT Problem List: Decreased activity tolerance;Pain      OT Treatment/Interventions:      OT Goals(Current goals can be found in the care plan section) Acute Rehab OT Goals Patient Stated Goal: to go home and eventually back to work  OT Goal Formulation: All assessment and education complete, DC therapy  OT Frequency:     Barriers to D/C:            Co-evaluation              AM-PAC OT "6 Clicks" Daily Activity     Outcome Measure Help from another person eating meals?: None Help from another person taking care of personal grooming?: A Little Help from another person toileting, which includes using toliet, bedpan, or urinal?: A Little Help from another person bathing (including washing, rinsing, drying)?: A Little Help from another person to put on and taking off regular upper body clothing?: A Little Help from another person to put on and taking off regular lower body clothing?: A Little 6 Click Score: 19   End of Session Equipment Utilized During Treatment: Cervical collar Nurse Communication: Mobility status  Activity Tolerance: Patient tolerated treatment well Patient left: in bed (sitting EOB )  OT Visit Diagnosis: Pain Pain - part of body:  (neck )                Time: 5520-8022 OT Time Calculation (min): 16 min Charges:  OT General Charges $OT Visit: 1 Visit OT Evaluation $OT Eval Low Complexity: 1 Low  Nilsa Nutting., OTR/L Acute Rehabilitation Services Pager  [phone] Office [phone]   Lucille Passy M 05/28/2020, 10:19 AM

## 2020-05-28 NOTE — Progress Notes (Signed)
    Subjective: Procedure(s) (LRB): ANTERIOR CERVICAL DECOMPRESSION/DISCECTOMY FUSION CERVICAL SIX-SEVEN (N/A) 1 Day Post-Op  Patient reports pain as 1 on 0-10 scale.  Reports decreased arm pain reports incisional neck pain   Positive void Negative bowel movement Positive flatus Negative chest pain or shortness of breath  Objective: Vital signs in last 24 hours: Temp:  [97.3 F (36.3 C)-99 F (37.2 C)] 99 F (37.2 C) (06/10 0352) Pulse Rate:  [69-108] 77 (06/10 0352) Resp:  [15-19] 18 (06/10 0352) BP: (111-139)/(58-79) 111/58 (06/10 0352) SpO2:  [92 %-98 %] 96 % (06/10 0352) Weight:  [60.8 kg] 60.8 kg (06/09 1213)  Intake/Output from previous day: 06/09 0701 - 06/10 0700 In: 920 [P.O.:120; I.V.:800] Out: 550 [Urine:500; Blood:50]  Labs: No results for input(s): WBC, RBC, HCT, PLT in the last 72 hours. No results for input(s): NA, K, CL, CO2, BUN, CREATININE, GLUCOSE, CALCIUM in the last 72 hours. No results for input(s): LABPT, INR in the last 72 hours.  Physical Exam: Neurologically intact ABD soft Intact pulses distally Incision: dressing C/D/I and no drainage Compartment soft Body mass index is 26.17 kg/m.  Assessment/Plan: Patient stable  xrays n/a Mobilization with physical therapy Encourage incentive spirometry Continue care  Advance diet Up with therapy  1.  Patient complained of posterior occipital headaches secondary to the Aspen collar.  I will provide her with a soft collar to use instead. 2.  She notes improvement in the preoperative radicular arm pain as well as the dysesthesias. 3.  Overall she is doing quite well.  She denies any dysphagia or dysphonia at present. 4.  Plan on discharge to home later today.  Patient is requesting tramadol for pain control.  She will follow-up with me in 2 weeks for wound check.  Melina Schools, MD [facility] [phone]

## 2020-05-28 NOTE — Progress Notes (Signed)
Patient is discharged from room 3C10 at this time. Alert and in stable condition. IV site d/c'd and instructions read to patient with understanding verbalized. Left unit via wheelchair with all belongings at side. 

## 2020-05-28 NOTE — Progress Notes (Signed)
Orthopedic Tech Progress Note Patient Details:  Morgan Johnston 1956-03-21 [id number] Left collar with patient.  Patient ID: [name], female   DOB: 1956/07/04, 64 y.o.   MRN: [id number]   Chip Boer 05/28/2020, 8:44 AM

## 2020-06-01 NOTE — Discharge Summary (Signed)
Patient ID: Morgan Johnston MRN: [id number] DOB/AGE: 1956-05-11 64 y.o.  Admit date: 05/27/2020 Discharge date: 06/01/2020  Admission Diagnoses:  Active Problems:   Cervical radiculopathy   Discharge Diagnoses:  Active Problems:   Cervical radiculopathy  status post Procedure(s): ANTERIOR CERVICAL DECOMPRESSION/DISCECTOMY FUSION CERVICAL SIX-SEVEN  Past Medical History:  Diagnosis Date  . Arthritis   . Asthma    CAUSED BY ACID REFLUX  . Back pain    DDD  . Bronchitis 2012  . Constipation   . Dizziness    r/t menstrual cycle;was given Meclizine and no problems in over a yr  . GERD (gastroesophageal reflux disease) 2013  . Headache(784.0)    occasionally  . Hx of seasonal allergies    takes Allegra daily  . Hyperlipidemia    takes Simvastatin nightly  . Insomnia    d/t neck pain and menopause  . Joint pain   . Joint swelling   . Pneumonia 2012    Surgeries: Procedure(s): ANTERIOR CERVICAL DECOMPRESSION/DISCECTOMY FUSION CERVICAL SIX-SEVEN on 05/27/2020   Consultants:   Discharged Condition: Improved  Hospital Course: Morgan Johnston is an 64 y.o. 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  Prior ACDF C4-6.Marland Kitchen Patient failed conservative treatments (please see the history and physical for the specifics) and had severe unremitting pain that affects sleep, daily activities and work/hobbies. After pre-op clearance, the patient was taken to the operating room on 05/27/2020 and underwent  Procedure(s): ANTERIOR CERVICAL DECOMPRESSION/DISCECTOMY FUSION CERVICAL SIX-SEVEN.    Patient was given perioperative antibiotics:  Anti-infectives (From admission, onward)   Start     Dose/Rate Route Frequency Ordered Stop   05/28/20 0130  vancomycin (VANCOCIN) IVPB 1000 mg/200 mL premix  Status:  Discontinued        1,000 mg 200 mL/hr over 60 Minutes Intravenous  Once 05/27/20 1911 05/27/20 1916   05/28/20  0100  vancomycin (VANCOCIN) IVPB 1000 mg/200 mL premix  Status:  Discontinued        1,000 mg 200 mL/hr over 60 Minutes Intravenous  Once 05/27/20 1903 05/27/20 1911   05/28/20 0100  vancomycin (VANCOCIN) IVPB 1000 mg/200 mL premix        1,000 mg 200 mL/hr over 60 Minutes Intravenous  Once 05/27/20 1916 05/28/20 0846   05/27/20 1224  vancomycin (VANCOCIN) 1-5 GM/200ML-% IVPB  Status:  Discontinued       Note to Pharmacy: Cordelia Pen   : cabinet override      05/27/20 1224 05/27/20 1247   05/27/20 1220  vancomycin (VANCOCIN) IVPB 1000 mg/200 mL premix        1,000 mg 200 mL/hr over 60 Minutes Intravenous 60 min pre-op 05/27/20 1220 05/27/20 1834       Patient was given sequential compression devices and early ambulation to prevent DVT.   Patient benefited maximally from hospital stay and there were no complications. At the time of discharge, the patient was urinating/moving their bowels without difficulty, tolerating a regular diet, pain is controlled with oral pain medications and they have been cleared by PT/OT.   Recent vital signs: No data found.   Recent laboratory studies: No results for input(s): WBC, HGB, HCT, PLT, NA, K, CL, CO2, BUN, CREATININE, GLUCOSE, INR, CALCIUM in the last 72 hours.  Invalid input(s): PT, 2   Discharge Medications:   Allergies as of 05/28/2020      Reactions   Augmentin [amoxicillin-pot Clavulanate] Anaphylaxis  Codeine Nausea Only   dizziness   Hydrocodone    Oxycodone       Medication List    STOP taking these medications   albuterol 108 (90 Base) MCG/ACT inhaler Commonly known as: ProAir HFA   naproxen sodium 220 MG tablet Commonly known as: ALEVE     TAKE these medications   CALCIUM 600 PO Take 600 mg by mouth daily.   fexofenadine 180 MG tablet Commonly known as: ALLEGRA Take 180 mg by mouth daily.   ibandronate 150 MG tablet Commonly known as: BONIVA Take 150 mg by mouth every 30 (thirty) days.   methocarbamol 500  MG tablet Commonly known as: Robaxin Take 1 tablet (500 mg total) by mouth every 8 (eight) hours as needed for up to 5 days for muscle spasms.   omeprazole 40 MG capsule Commonly known as: PRILOSEC TAKE 1 CAPSULE (40 MG TOTAL) BY MOUTH 2 (TWO) TIMES DAILY.   ondansetron 4 MG tablet Commonly known as: Zofran Take 1 tablet (4 mg total) by mouth every 8 (eight) hours as needed for nausea or vomiting.   paroxetine mesylate 10 MG tablet Commonly known as: PEXEVA Take 10 mg by mouth at bedtime.   simvastatin 20 MG tablet Commonly known as: ZOCOR Take 20 mg by mouth every evening.   traMADol 50 MG tablet Commonly known as: ULTRAM Take 1 tablet (50 mg total) by mouth every 6 (six) hours as needed for up to 5 days.   VITAMIN D PO Take 3,000 Units by mouth daily.       Diagnostic Studies: DG Chest 2 View  Result Date: 05/20/2020 CLINICAL DATA:  Pre-op for ACDF C6-7 on 05/27/20. Pt denies all chest complaints at this time. EXAM: CHEST - 2 VIEW COMPARISON:  Chest radiograph 03/07/2016 FINDINGS: The heart size and mediastinal contours are within normal limits. The lungs are clear. No pneumothorax or pleural effusion. No acute finding in the visualized skeleton. IMPRESSION: No acute cardiopulmonary process. Electronically Signed   By: Audie Pinto M.D.   On: 05/20/2020 14:43   DG Cervical Spine 2-3 Views  Result Date: 05/27/2020 CLINICAL DATA:  Anterior cervical decompression discectomy. EXAM: CERVICAL SPINE - 2-3 VIEW; DG C-ARM 1-60 MIN COMPARISON:  None. FINDINGS: Three fluoroscopic spot views obtained in the operating room. Multilevel anterior fusion with interbody spacers. Anterior fusion C4 through C6 with interbody spacer. Fusion also at C6-C7. Patient is intubated. Fluoroscopy time 40 seconds. Dose 4.39 mGy. IMPRESSION: Intraoperative fluoroscopy during cervical fusion. Electronically Signed   By: Keith Rake M.D.   On: 05/27/2020 18:51   DG C-Arm 1-60 Min  Result Date:  05/27/2020 CLINICAL DATA:  Anterior cervical decompression discectomy. EXAM: CERVICAL SPINE - 2-3 VIEW; DG C-ARM 1-60 MIN COMPARISON:  None. FINDINGS: Three fluoroscopic spot views obtained in the operating room. Multilevel anterior fusion with interbody spacers. Anterior fusion C4 through C6 with interbody spacer. Fusion also at C6-C7. Patient is intubated. Fluoroscopy time 40 seconds. Dose 4.39 mGy. IMPRESSION: Intraoperative fluoroscopy during cervical fusion. Electronically Signed   By: Keith Rake M.D.   On: 05/27/2020 18:51    Discharge Instructions    Incentive spirometry RT   Complete by: As directed        Follow-up Information    Melina Schools, MD. Schedule an appointment as soon as possible for a visit in 2 weeks.   Specialty: Orthopedic Surgery Why: If symptoms worsen, For suture removal, For wound re-check Contact information: [address] [place] [address]  [phone]               Discharge Plan:  discharge to home  Disposition: stable    Signed: Yvonne Kendall Shunt[facility] PA-C [facility] [phone] 06/01/2020, 2:05 PM

## 2020-08-31 ENCOUNTER — Other Ambulatory Visit: Payer: Self-pay | Admitting: Gastroenterology

## 2020-08-31 DIAGNOSIS — Z800 Family history of malignant neoplasm of digestive organs: Secondary | ICD-10-CM

## 2020-08-31 DIAGNOSIS — Z1211 Encounter for screening for malignant neoplasm of colon: Secondary | ICD-10-CM

## 2020-09-01 ENCOUNTER — Ambulatory Visit
Admission: RE | Admit: 2020-09-01 | Discharge: 2020-09-01 | Disposition: A | Discharge status: Home / Self Care | Payer: BC Managed Care – PPO | Source: Ambulatory Visit | Attending: Gastroenterology | Admitting: Gastroenterology

## 2020-09-01 ENCOUNTER — Other Ambulatory Visit: Payer: Self-pay | Admitting: Gastroenterology

## 2020-09-01 DIAGNOSIS — R109 Unspecified abdominal pain: Secondary | ICD-10-CM

## 2020-09-18 ENCOUNTER — Inpatient Hospital Stay: Admission: RE | Admit: 2020-09-18 | Payer: BC Managed Care – PPO | Source: Ambulatory Visit

## 2020-10-06 ENCOUNTER — Ambulatory Visit
Admission: RE | Admit: 2020-10-06 | Discharge: 2020-10-06 | Disposition: A | Discharge status: Home / Self Care | Payer: BC Managed Care – PPO | Source: Ambulatory Visit | Attending: Gastroenterology | Admitting: Gastroenterology

## 2020-10-06 DIAGNOSIS — Z1211 Encounter for screening for malignant neoplasm of colon: Secondary | ICD-10-CM

## 2020-10-06 DIAGNOSIS — Z800 Family history of malignant neoplasm of digestive organs: Secondary | ICD-10-CM

## 2021-02-01 IMAGING — CT CT VIRTUAL COLONOSCOPY SCREENING
2 of 9 series · 12 of 46 positions shown, 18 images · non-contrast
Comparison: 08/11/2014

CLINICAL DATA: Redundant colon

EXAM:
CT VIRTUAL COLONOSCOPY SCREENING
TECHNIQUE: The patient was given a standard bowel preparation with Gastrografin
and barium for fluid and stool tagging respectively. The quality of
the bowel preparation is fair. Automated CO2 insufflation of the
colon was performed prior to image acquisition and colonic
distention is good. Image post processing was used to generate a 3D
endoluminal fly-through projection of the colon and to
electronically subtract stool/fluid as appropriate.

[Series 6: supine colon 3.00 br40 s3 cor supine · coronal · 0.76mm/px · 3 of 99 slices shown]
[im 25/99  soft-tissue]
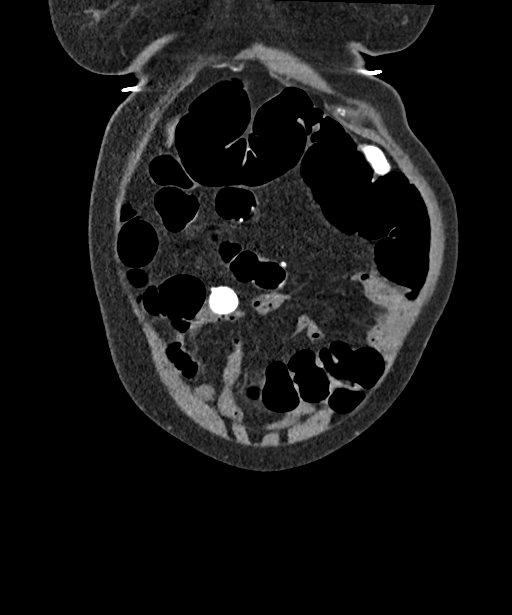
[im 50/99  soft-tissue]
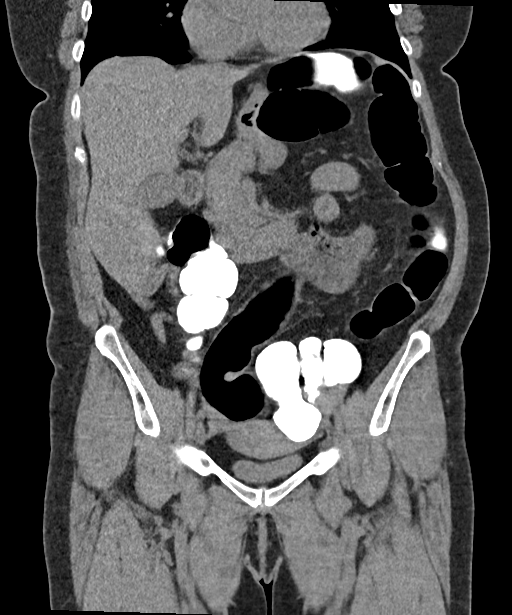
[im 74/99  soft-tissue]
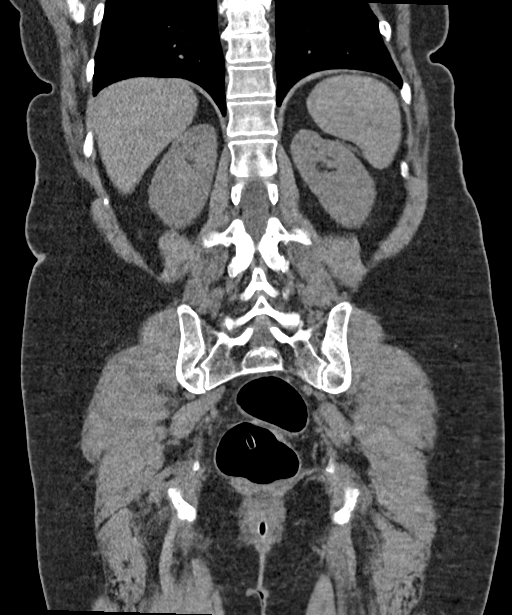

[Series 11: prone colon 1.50 br40 s3 prone thin · axial · 0.78mm/px · z∈[+1258,+1664]mm · 9 of 339 slices shown, 15 images]
[im 34/339  soft-tissue]
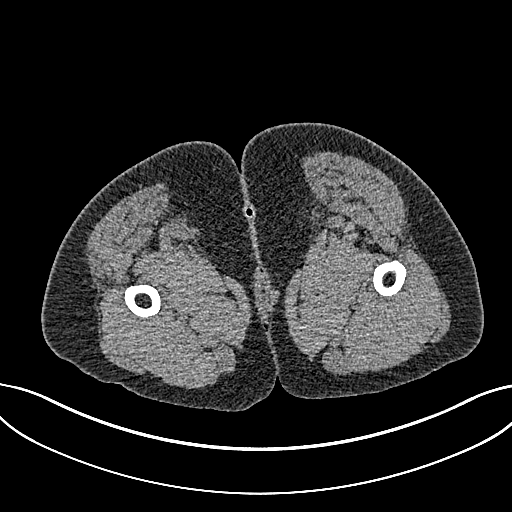
[im 34/339  bone]
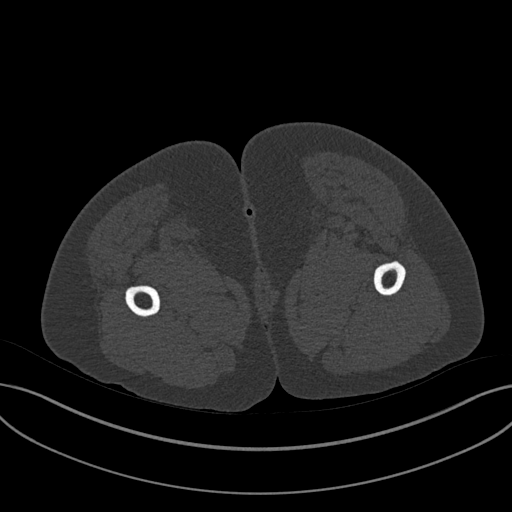
[im 68/339  soft-tissue]
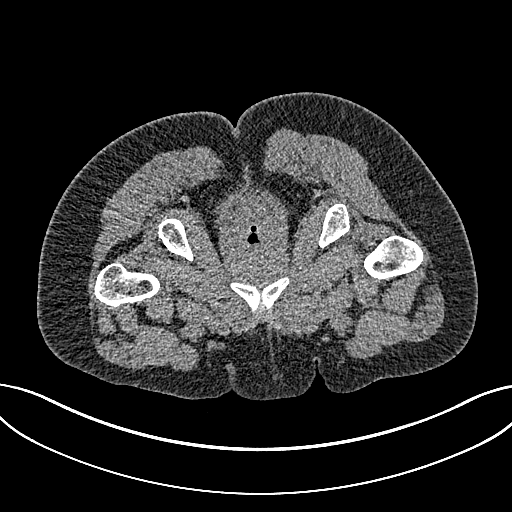
[im 102/339  soft-tissue]
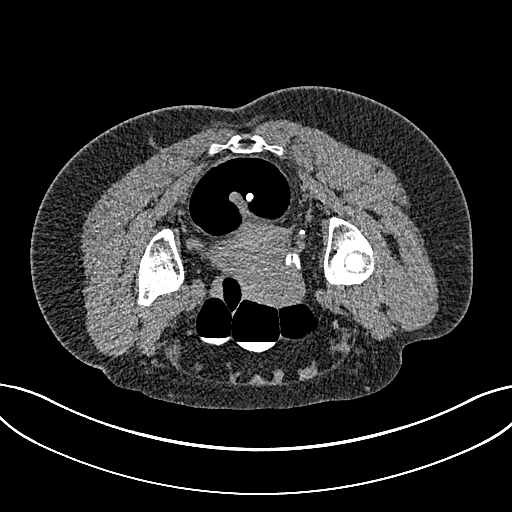
[im 136/339  soft-tissue]
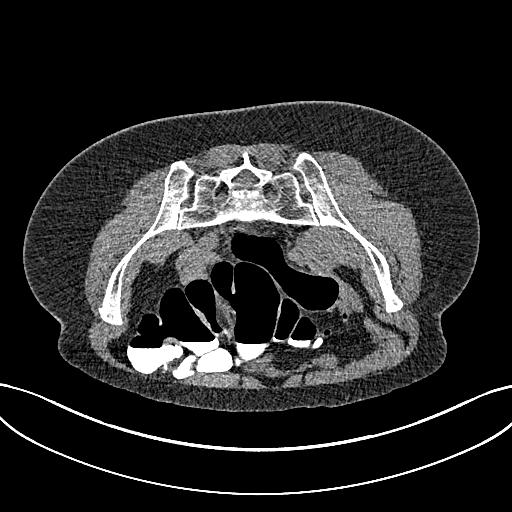
[im 170/339  soft-tissue]
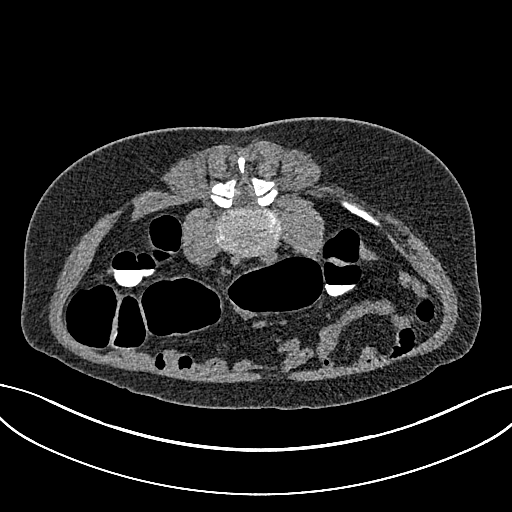
[im 203/339  soft-tissue]
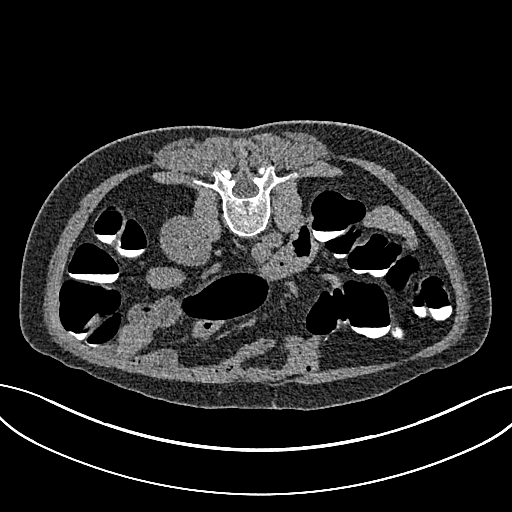
[im 203/339  lung]
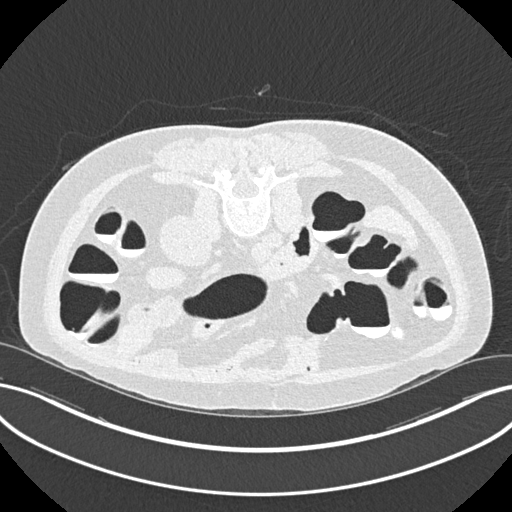
[im 237/339  soft-tissue]
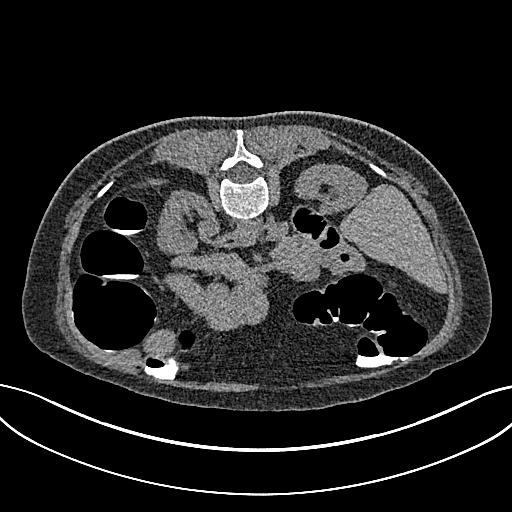
[im 237/339  lung]
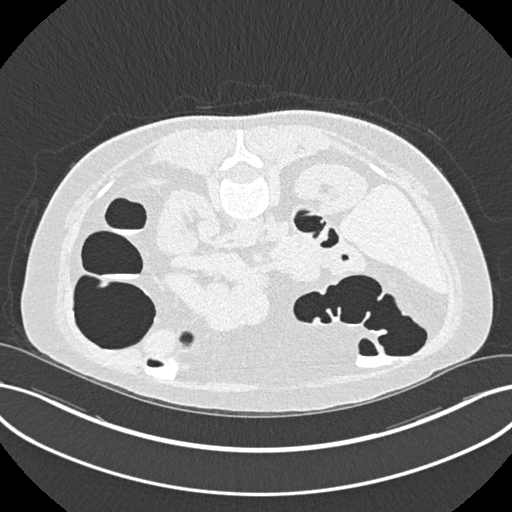
[im 271/339  soft-tissue]
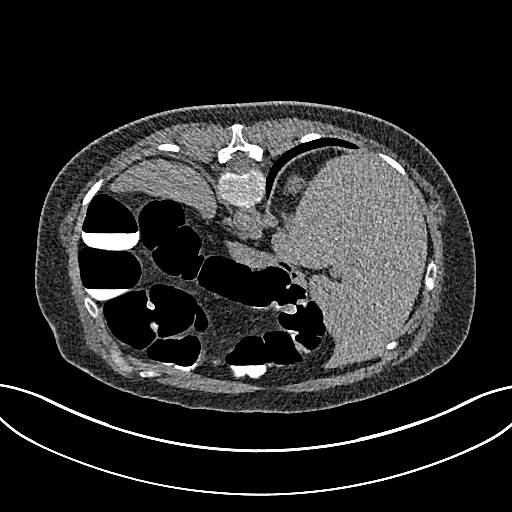
[im 271/339  lung]
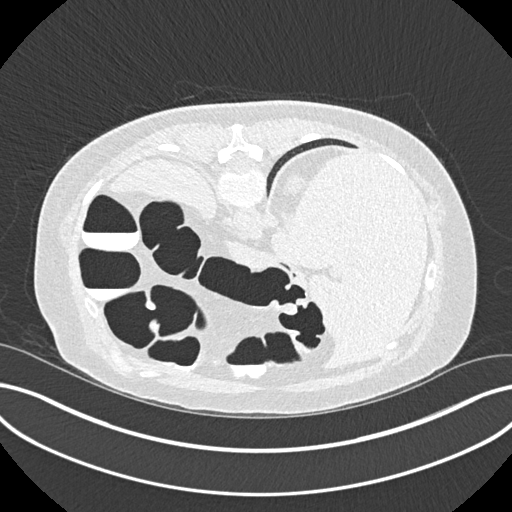
[im 305/339  soft-tissue]
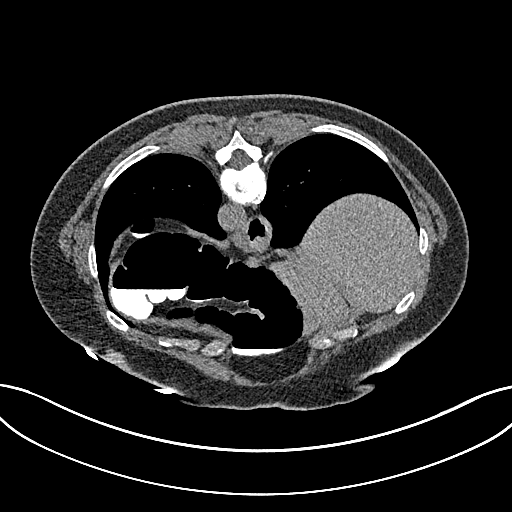
[im 305/339  lung]
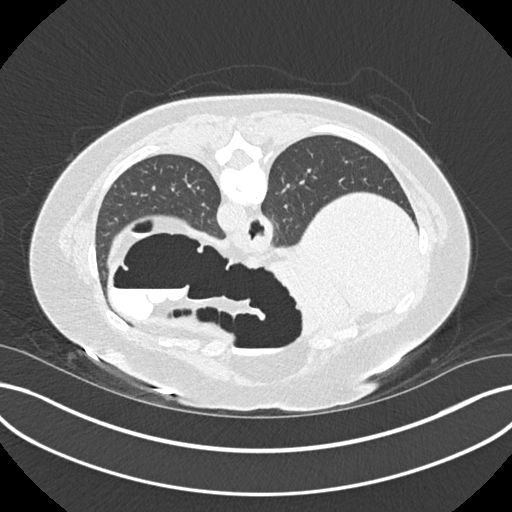
[im 305/339  bone]
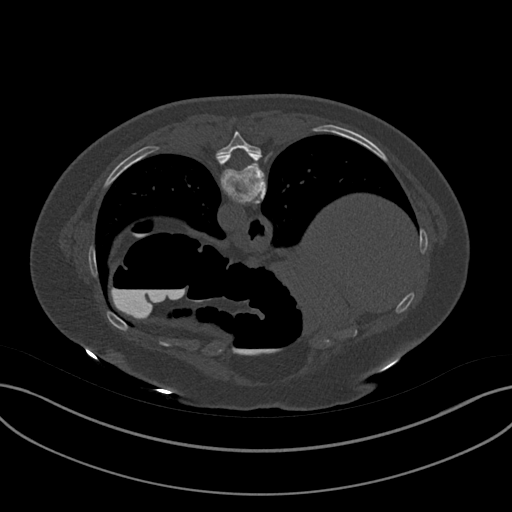

[12 of 46 positions shown; findings below may reference images not displayed]

FINDINGS: VIRTUAL COLONOSCOPY

There is moderate retained barium and barium tagged stool throughout
the colon. There is a flat filling defect5 noted posteriorly in the
rectosigmoid region which persists on both supine and prone images
concerning for possibility of sessile polyp measuring 8 mm. No
additional suspicious non barium tagged polypoid filling defects or
annular constricting lesions. Markedly tortuous colon. Scattered
diverticula in the right colon.

Virtual colonoscopy is not designed to detect diminutive polyps
(i.e., less than or equal to 5 mm), the presence or absence of which
may not affect clinical management.

CT ABDOMEN AND PELVIS WITHOUT CONTRAST

Lower chest: Lung bases are clear. No effusions. Heart is normal
size.

Hepatobiliary: No focal hepatic abnormality. Gallbladder
unremarkable.

Pancreas: No focal abnormality or ductal dilatation.

Spleen: No focal abnormality.  Normal size.

Adrenals/Urinary Tract: Punctate nonobstructing stone in the lower
pole of the left kidney. No hydronephrosis. Adrenal glands and
urinary bladder unremarkable.

Stomach/Bowel: Stomach and small bowel decompressed, unremarkable.

Vascular/Lymphatic: No evidence of aneurysm or adenopathy.

Reproductive: Uterus and adnexa unremarkable.  No mass.

Other: No free fluid or free air.

Musculoskeletal: No acute bony abnormality.
IMPRESSION: Posterior filling defect noted along the posterior wall of the upper
rectum or lower sigmoid colon measuring 8 mm concerning for sessile
polyp.

Markedly tortuous colon.  Scattered colonic diverticula.

Punctate left lower pole nephrolithiasis.

## 2021-12-19 HISTORY — PX: ROTATOR CUFF REPAIR: SHX139

## 2022-03-07 ENCOUNTER — Other Ambulatory Visit: Payer: Self-pay | Admitting: Endocrinology

## 2022-03-07 DIAGNOSIS — M810 Age-related osteoporosis without current pathological fracture: Secondary | ICD-10-CM

## 2022-05-13 ENCOUNTER — Other Ambulatory Visit: Payer: Self-pay | Admitting: Family Medicine

## 2022-05-13 DIAGNOSIS — E782 Mixed hyperlipidemia: Secondary | ICD-10-CM

## 2022-05-20 ENCOUNTER — Ambulatory Visit
Admission: RE | Admit: 2022-05-20 | Discharge: 2022-05-20 | Disposition: A | Discharge status: Home / Self Care | Payer: Medicare Other | Source: Ambulatory Visit | Attending: Endocrinology | Admitting: Endocrinology

## 2022-05-20 DIAGNOSIS — M810 Age-related osteoporosis without current pathological fracture: Secondary | ICD-10-CM

## 2022-06-28 ENCOUNTER — Ambulatory Visit
Admission: RE | Admit: 2022-06-28 | Discharge: 2022-06-28 | Disposition: A | Discharge status: Home / Self Care | Payer: No Typology Code available for payment source | Source: Ambulatory Visit | Attending: Family Medicine | Admitting: Family Medicine

## 2022-06-28 DIAGNOSIS — E782 Mixed hyperlipidemia: Secondary | ICD-10-CM

## 2022-09-09 NOTE — Patient Instructions (Signed)
DUE TO COVID-19 ONLY TWO VISITORS  (aged 65 and older)  ARE ALLOWED TO COME WITH YOU AND STAY IN THE WAITING ROOM ONLY DURING PRE OP AND PROCEDURE.   **NO VISITORS ARE ALLOWED IN THE SHORT STAY AREA OR RECOVERY ROOM!!**  IF YOU WILL BE ADMITTED INTO THE HOSPITAL YOU ARE ALLOWED ONLY FOUR SUPPORT PEOPLE DURING VISITATION HOURS ONLY (7 AM -8PM)   The support person(s) must pass our screening, gel in and out, and wear a mask at all times, including in the patient's room. Patients must also wear a mask when staff or their support person are in the room. Visitors GUEST BADGE MUST BE WORN VISIBLY  One adult visitor may remain with you overnight and MUST be in the room by 8 P.M.     Your procedure is scheduled on: 09/15/22   Report to [facility] Main Entrance    Report to admitting at : 12:30 PM   Call this number if you have problems the morning of surgery [phone]   Do not eat food :After Midnight.   After Midnight you may have the following liquids until : 12:00 PM DAY OF SURGERY  Water Black Coffee (sugar ok, NO MILK/CREAM OR CREAMERS)  Tea (sugar ok, NO MILK/CREAM OR CREAMERS) regular and decaf                             Plain Jell-O (NO RED)                                           Fruit ices (not with fruit pulp, NO RED)                                     Popsicles (NO RED)                                                                  Juice: apple, WHITE grape, WHITE cranberry Sports drinks like Gatorade (NO RED)              Drink Ensure drink AT: 12:00 PM the day of surgery.     The day of surgery:  Drink ONE (1) Pre-Surgery Clear Ensure or G2 at AM the morning of surgery. Drink in one sitting. Do not sip.  This drink was given to you during your hospital  pre-op appointment visit. Nothing else to drink after completing the  Pre-Surgery Clear Ensure or G2.          If you have questions, please contact your surgeon's office.   Oral Hygiene is also  important to reduce your risk of infection.                                    Remember - BRUSH YOUR TEETH THE MORNING OF SURGERY WITH YOUR REGULAR TOOTHPASTE   Do NOT smoke after Midnight   Take these medicines the morning of surgery with A SIP OF  WATER:   DO NOT TAKE ANY ORAL DIABETIC MEDICATIONS DAY OF YOUR SURGERY  Bring CPAP mask and tubing day of surgery.                              You may not have any metal on your body including hair pins, jewelry, and body piercing             Do not wear make-up, lotions, powders, perfumes/cologne, or deodorant  Do not wear nail polish including gel and S&S, artificial/acrylic nails, or any other type of covering on natural nails including finger and toenails. If you have artificial nails, gel coating, etc. that needs to be removed by a nail salon please have this removed prior to surgery or surgery may need to be canceled/ delayed if the surgeon/ anesthesia feels like they are unable to be safely monitored.   Do not shave  48 hours prior to surgery.   Do not bring valuables to the hospital. [place].   Contacts, dentures or bridgework may not be worn into surgery.   Bring small overnight bag day of surgery.   DO NOT [place]. PHARMACY WILL DISPENSE MEDICATIONS LISTED ON YOUR MEDICATION LIST TO YOU DURING YOUR ADMISSION [place]!    Patients discharged on the day of surgery will not be allowed to drive home.  Someone NEEDS to stay with you for the first 24 hours after anesthesia.   Special Instructions: Bring a copy of your healthcare power of attorney and living will documents         the day of surgery if you haven't scanned them before.              Please read over the following fact sheets you were given: IF YOU HAVE QUESTIONS ABOUT YOUR PRE-OP INSTRUCTIONS PLEASE CALL [phone]     [facility] - Preparing for Surgery Before surgery, you  can play an important role.  Because skin is not sterile, your skin needs to be as free of germs as possible.  You can reduce the number of germs on your skin by washing with CHG (chlorahexidine gluconate) soap before surgery.  CHG is an antiseptic cleaner which kills germs and bonds with the skin to continue killing germs even after washing. Please DO NOT use if you have an allergy to CHG or antibacterial soaps.  If your skin becomes reddened/irritated stop using the CHG and inform your nurse when you arrive at Short Stay. Do not shave (including legs and underarms) for at least 48 hours prior to the first CHG shower.  You may shave your face/neck. Please follow these instructions carefully:  1.  Shower with CHG Soap the night before surgery and the  morning of Surgery.  2.  If you choose to wash your hair, wash your hair first as usual with your  normal  shampoo.  3.  After you shampoo, rinse your hair and body thoroughly to remove the  shampoo.                           4.  Use CHG as you would any other liquid soap.  You can apply chg directly  to the skin and wash  Gently with a scrungie or clean washcloth.  5.  Apply the CHG Soap to your body ONLY FROM THE NECK DOWN.   Do not use on face/ open                           Wound or open sores. Avoid contact with eyes, ears mouth and genitals (private parts).                       Wash face,  Genitals (private parts) with your normal soap.             6.  Wash thoroughly, paying special attention to the area where your surgery  will be performed.  7.  Thoroughly rinse your body with warm water from the neck down.  8.  DO NOT shower/wash with your normal soap after using and rinsing off  the CHG Soap.                9.  Pat yourself dry with a clean towel.            10.  Wear clean pajamas.            11.  Place clean sheets on your bed the night of your first shower and do not  sleep with pets. Day of Surgery : Do not apply  any lotions/deodorants the morning of surgery.  Please wear clean clothes to the hospital/surgery center.  FAILURE TO FOLLOW THESE INSTRUCTIONS MAY RESULT IN THE CANCELLATION OF YOUR SURGERY PATIENT SIGNATURE_________________________________  NURSE SIGNATURE__________________________________  ________________________________________________________________________  [facility]- Preparing for Total Shoulder Arthroplasty    Before surgery, you can play an important role. Because skin is not sterile, your skin needs to be as free of germs as possible. You can reduce the number of germs on your skin by using the following products. Benzoyl Peroxide Gel Reduces the number of germs present on the skin Applied twice a day to shoulder area starting two days before surgery    ==================================================================  Please follow these instructions carefully:  BENZOYL PEROXIDE 5% GEL  Please do not use if you have an allergy to benzoyl peroxide.   If your skin becomes reddened/irritated stop using the benzoyl peroxide.  Starting two days before surgery, apply as follows: Apply benzoyl peroxide in the morning and at night. Apply after taking a shower. If you are not taking a shower clean entire shoulder front, back, and side along with the armpit with a clean wet washcloth.  Place a quarter-sized dollop on your shoulder and rub in thoroughly, making sure to cover the front, back, and side of your shoulder, along with the armpit.   2 days before ____ AM   ____ PM              1 day before ____ AM   ____ PM                         Do this twice a day for two days.  (Last application is the night before surgery, AFTER using the CHG soap as described below).  Do NOT apply benzoyl peroxide gel on the day of surgery.  Incentive Spirometer  An incentive spirometer is a tool that can help keep your lungs clear and active. This tool measures how well you are filling your  lungs with each breath. Taking long deep breaths may help  reverse or decrease the chance of developing breathing (pulmonary) problems (especially infection) following: A long period of time when you are unable to move or be active. BEFORE THE PROCEDURE  If the spirometer includes an indicator to show your best effort, your nurse or respiratory therapist will set it to a desired goal. If possible, sit up straight or lean slightly forward. Try not to slouch. Hold the incentive spirometer in an upright position. INSTRUCTIONS FOR USE  Sit on the edge of your bed if possible, or sit up as far as you can in bed or on a chair. Hold the incentive spirometer in an upright position. Breathe out normally. Place the mouthpiece in your mouth and seal your lips tightly around it. Breathe in slowly and as deeply as possible, raising the piston or the ball toward the top of the column. Hold your breath for 3-5 seconds or for as long as possible. Allow the piston or ball to fall to the bottom of the column. Remove the mouthpiece from your mouth and breathe out normally. Rest for a few seconds and repeat Steps 1 through 7 at least 10 times every 1-2 hours when you are awake. Take your time and take a few normal breaths between deep breaths. The spirometer may include an indicator to show your best effort. Use the indicator as a goal to work toward during each repetition. After each set of 10 deep breaths, practice coughing to be sure your lungs are clear. If you have an incision (the cut made at the time of surgery), support your incision when coughing by placing a pillow or rolled up towels firmly against it. Once you are able to get out of bed, walk around indoors and cough well. You may stop using the incentive spirometer when instructed by your caregiver.  RISKS AND COMPLICATIONS Take your time so you do not get dizzy or light-headed. If you are in pain, you may need to take or ask for pain medication before  doing incentive spirometry. It is harder to take a deep breath if you are having pain. AFTER USE Rest and breathe slowly and easily. It can be helpful to keep track of a log of your progress. Your caregiver can provide you with a simple table to help with this. If you are using the spirometer at home, follow these instructions: [place] IF:  You are having difficultly using the spirometer. You have trouble using the spirometer as often as instructed. Your pain medication is not giving enough relief while using the spirometer. You develop fever of 100.5 F (38.1 C) or higher. SEEK IMMEDIATE MEDICAL CARE IF:  You cough up bloody sputum that had not been present before. You develop fever of 102 F (38.9 C) or greater. You develop worsening pain at or near the incision site. MAKE SURE YOU:  Understand these instructions. Will watch your condition. Will get help right away if you are not doing well or get worse. Document Released: 04/17/2007 Document Revised: 02/27/2012 Document Reviewed: 06/18/2007 [facility] Patient Information 2014 [place], [place].   ________________________________________________________________________

## 2022-09-12 ENCOUNTER — Other Ambulatory Visit: Payer: Self-pay

## 2022-09-12 ENCOUNTER — Encounter (HOSPITAL_COMMUNITY)
Admission: RE | Admit: 2022-09-12 | Discharge: 2022-09-12 | Disposition: A | Discharge status: Home / Self Care | Payer: Medicare Other | Source: Ambulatory Visit | Attending: Orthopedic Surgery | Admitting: Orthopedic Surgery

## 2022-09-12 ENCOUNTER — Encounter (HOSPITAL_COMMUNITY): Payer: Self-pay

## 2022-09-12 VITALS — BP 112/71 | HR 78 | Temp 98.3°F | Ht 59.5 in | Wt 128.0 lb

## 2022-09-12 DIAGNOSIS — Z01818 Encounter for other preprocedural examination: Secondary | ICD-10-CM | POA: Insufficient documentation

## 2022-09-12 DIAGNOSIS — R079 Chest pain, unspecified: Secondary | ICD-10-CM | POA: Diagnosis not present

## 2022-09-12 HISTORY — DX: Malignant (primary) neoplasm, unspecified: C80.1

## 2022-09-12 LAB — CBC
HCT: 37.9 % (ref 36.0–46.0)
Hemoglobin: 12.2 g/dL (ref 12.0–15.0)
MCH: 29.3 pg (ref 26.0–34.0)
MCHC: 32.2 g/dL (ref 30.0–36.0)
MCV: 91.1 fL (ref 80.0–100.0)
Platelets: 241 10*3/uL (ref 150–400)
RBC: 4.16 MIL/uL (ref 3.87–5.11)
RDW: 13.2 % (ref 11.5–15.5)
WBC: 5.8 10*3/uL (ref 4.0–10.5)
nRBC: 0 % (ref 0.0–0.2)

## 2022-09-12 LAB — BASIC METABOLIC PANEL
Anion gap: 7 (ref 5–15)
BUN: 18 mg/dL (ref 8–23)
CO2: 24 mmol/L (ref 22–32)
Calcium: 9.3 mg/dL (ref 8.9–10.3)
Chloride: 109 mmol/L (ref 98–111)
Creatinine, Ser: 0.67 mg/dL (ref 0.44–1.00)
GFR, Estimated: >60 mL/min (ref >60)
Glucose, Bld: 98 mg/dL (ref 70–99)
Potassium: 4.1 mmol/L (ref 3.5–5.1)
Sodium: 140 mmol/L (ref 135–145)

## 2022-09-12 LAB — SURGICAL PCR SCREEN
MRSA, PCR: NEGATIVE
Staphylococcus aureus: NEGATIVE

## 2022-09-12 NOTE — Progress Notes (Signed)
For Short Stay: [place] appointment date: Date of COVID positive in last 78 days:  Bowel Prep reminder:   For Anesthesia: PCP - Dr. Kathyrn Lass Cardiologist - N/A  Chest x-ray -  EKG -  Stress Test -  ECHO -  Cardiac Cath -  Pacemaker/ICD device last checked: Pacemaker orders received: Device Rep notified:  Spinal Cord Stimulator:  Sleep Study -  CPAP -   Fasting Blood Sugar -  Checks Blood Sugar _____ times a day Date and result of last Hgb A1c-  Blood Thinner Instructions: Aspirin Instructions: Last Dose:  Activity level: Can go up a flight of stairs and activities of daily living without stopping and without chest pain and/or shortness of breath   Able to exercise without chest pain and/or shortness of breath   Unable to go up a flight of stairs without chest pain and/or shortness of breath     Anesthesia review:   Patient denies shortness of breath, fever, cough and chest pain at PAT appointment   Patient verbalized understanding of instructions that were given to them at the PAT appointment. Patient was also instructed that they will need to review over the PAT instructions again at home before surgery.

## 2022-09-15 ENCOUNTER — Ambulatory Visit (HOSPITAL_COMMUNITY)
Admission: RE | Admit: 2022-09-15 | Discharge: 2022-09-15 | Disposition: A | Discharge status: Home / Self Care | Payer: Medicare Other | Attending: Orthopedic Surgery | Admitting: Orthopedic Surgery

## 2022-09-15 ENCOUNTER — Ambulatory Visit (HOSPITAL_COMMUNITY): Payer: Medicare Other | Admitting: Anesthesiology

## 2022-09-15 ENCOUNTER — Other Ambulatory Visit: Payer: Self-pay

## 2022-09-15 ENCOUNTER — Encounter (HOSPITAL_COMMUNITY): Payer: Self-pay | Admitting: Orthopedic Surgery

## 2022-09-15 ENCOUNTER — Encounter (HOSPITAL_COMMUNITY)
Admission: RE | Disposition: A | Discharge status: Home / Self Care | Payer: Self-pay | Source: Home / Self Care | Attending: Orthopedic Surgery

## 2022-09-15 ENCOUNTER — Ambulatory Visit (HOSPITAL_BASED_OUTPATIENT_CLINIC_OR_DEPARTMENT_OTHER): Payer: Medicare Other | Admitting: Anesthesiology

## 2022-09-15 DIAGNOSIS — M75101 Unspecified rotator cuff tear or rupture of right shoulder, not specified as traumatic: Secondary | ICD-10-CM

## 2022-09-15 DIAGNOSIS — M12811 Other specific arthropathies, not elsewhere classified, right shoulder: Secondary | ICD-10-CM

## 2022-09-15 DIAGNOSIS — M19011 Primary osteoarthritis, right shoulder: Secondary | ICD-10-CM | POA: Diagnosis not present

## 2022-09-15 HISTORY — PX: REVERSE SHOULDER ARTHROPLASTY: SHX5054

## 2022-09-15 SURGERY — ARTHROPLASTY, SHOULDER, TOTAL, REVERSE
Anesthesia: General | Site: Shoulder | Laterality: Right

## 2022-09-15 MED ORDER — PROPOFOL 10 MG/ML IV BOLUS
INTRAVENOUS | Status: AC
  Filled 2022-09-15: qty 20

## 2022-09-15 MED ORDER — ORAL CARE MOUTH RINSE: 15.0000 mL | Freq: Once | OROMUCOSAL | Status: AC

## 2022-09-15 MED ORDER — CEFAZOLIN SODIUM-DEXTROSE 2-4 GM/100ML-% IV SOLN
2.0000 g | INTRAVENOUS | Status: DC
  Administered 2022-09-15: 2 g via INTRAVENOUS

## 2022-09-15 MED ORDER — PHENYLEPHRINE 80 MCG/ML (10ML) SYRINGE FOR IV PUSH (FOR BLOOD PRESSURE SUPPORT)
PREFILLED_SYRINGE | INTRAVENOUS | Status: AC
  Filled 2022-09-15: qty 20

## 2022-09-15 MED ORDER — PHENYLEPHRINE HCL (PRESSORS) 10 MG/ML IV SOLN
INTRAVENOUS | Status: AC
  Filled 2022-09-15: qty 1

## 2022-09-15 MED ORDER — AMISULPRIDE (ANTIEMETIC) 5 MG/2ML IV SOLN: 10.0000 mg | Freq: Once | INTRAVENOUS | Status: DC | PRN

## 2022-09-15 MED ORDER — LIDOCAINE HCL (PF) 2 % IJ SOLN
INTRAMUSCULAR | Status: AC
  Filled 2022-09-15: qty 5

## 2022-09-15 MED ORDER — VANCOMYCIN HCL 1000 MG IV SOLR
INTRAVENOUS | Status: DC | PRN
  Administered 2022-09-15: 1000 mg via TOPICAL

## 2022-09-15 MED ORDER — HYDROMORPHONE HCL 2 MG PO TABS: 2.0000 mg | ORAL_TABLET | ORAL | 0 refills | Status: AC | PRN

## 2022-09-15 MED ORDER — TRANEXAMIC ACID-NACL 1000-0.7 MG/100ML-% IV SOLN
1000.0000 mg | INTRAVENOUS | Status: AC
  Administered 2022-09-15: 1000 mg via INTRAVENOUS
  Filled 2022-09-15: qty 100

## 2022-09-15 MED ORDER — TRAMADOL HCL 50 MG PO TABS: 50.0000 mg | ORAL_TABLET | Freq: Four times a day (QID) | ORAL | 0 refills | Status: AC | PRN

## 2022-09-15 MED ORDER — CHLORHEXIDINE GLUCONATE 0.12 % MT SOLN
15.0000 mL | Freq: Once | OROMUCOSAL | Status: AC
  Administered 2022-09-15: 15 mL via OROMUCOSAL

## 2022-09-15 MED ORDER — ONDANSETRON HCL 4 MG PO TABS: 4.0000 mg | ORAL_TABLET | Freq: Four times a day (QID) | ORAL | Status: DC | PRN

## 2022-09-15 MED ORDER — PHENYLEPHRINE 80 MCG/ML (10ML) SYRINGE FOR IV PUSH (FOR BLOOD PRESSURE SUPPORT)
PREFILLED_SYRINGE | INTRAVENOUS | Status: DC | PRN
  Administered 2022-09-15 (×3): 120 ug via INTRAVENOUS

## 2022-09-15 MED ORDER — MIDAZOLAM HCL 2 MG/2ML IJ SOLN
INTRAMUSCULAR | Status: AC
  Filled 2022-09-15: qty 2

## 2022-09-15 MED ORDER — LACTATED RINGERS IV BOLUS
250.0000 mL | Freq: Once | INTRAVENOUS | Status: AC
  Administered 2022-09-15: 250 mL via INTRAVENOUS

## 2022-09-15 MED ORDER — ONDANSETRON HCL 4 MG/2ML IJ SOLN: 4.0000 mg | Freq: Once | INTRAMUSCULAR | Status: DC | PRN

## 2022-09-15 MED ORDER — BUPIVACAINE LIPOSOME 1.3 % IJ SUSP
INTRAMUSCULAR | Status: DC | PRN
  Administered 2022-09-15: 10 mL via PERINEURAL

## 2022-09-15 MED ORDER — EPHEDRINE 5 MG/ML INJ
INTRAVENOUS | Status: AC
  Filled 2022-09-15: qty 5

## 2022-09-15 MED ORDER — LIDOCAINE 2% (20 MG/ML) 5 ML SYRINGE
INTRAMUSCULAR | Status: DC | PRN
  Administered 2022-09-15: 60 mg via INTRAVENOUS

## 2022-09-15 MED ORDER — ROCURONIUM BROMIDE 10 MG/ML (PF) SYRINGE
PREFILLED_SYRINGE | INTRAVENOUS | Status: AC
  Filled 2022-09-15: qty 10

## 2022-09-15 MED ORDER — LACTATED RINGERS IV BOLUS
500.0000 mL | Freq: Once | INTRAVENOUS | Status: AC
  Administered 2022-09-15: 500 mL via INTRAVENOUS

## 2022-09-15 MED ORDER — FENTANYL CITRATE PF 50 MCG/ML IJ SOSY: 25.0000 ug | PREFILLED_SYRINGE | INTRAMUSCULAR | Status: DC | PRN

## 2022-09-15 MED ORDER — 0.9 % SODIUM CHLORIDE (POUR BTL) OPTIME
TOPICAL | Status: DC | PRN
  Administered 2022-09-15: 1000 mL

## 2022-09-15 MED ORDER — PROPOFOL 10 MG/ML IV BOLUS
INTRAVENOUS | Status: DC | PRN
  Administered 2022-09-15: 50 mg via INTRAVENOUS
  Administered 2022-09-15: 150 mg via INTRAVENOUS

## 2022-09-15 MED ORDER — ONDANSETRON HCL 4 MG/2ML IJ SOLN
4.0000 mg | Freq: Four times a day (QID) | INTRAMUSCULAR | Status: DC | PRN
  Administered 2022-09-15: 4 mg via INTRAVENOUS

## 2022-09-15 MED ORDER — METOCLOPRAMIDE HCL 5 MG/ML IJ SOLN: 5.0000 mg | Freq: Three times a day (TID) | INTRAMUSCULAR | Status: DC | PRN

## 2022-09-15 MED ORDER — CELECOXIB 200 MG PO CAPS: 200.0000 mg | ORAL_CAPSULE | Freq: Every day | ORAL | 1 refills | Status: AC

## 2022-09-15 MED ORDER — PHENYLEPHRINE HCL-NACL 20-0.9 MG/250ML-% IV SOLN
INTRAVENOUS | Status: DC | PRN
  Administered 2022-09-15: 40 ug/min via INTRAVENOUS

## 2022-09-15 MED ORDER — SUGAMMADEX SODIUM 200 MG/2ML IV SOLN
INTRAVENOUS | Status: DC | PRN
  Administered 2022-09-15: 200 mg via INTRAVENOUS

## 2022-09-15 MED ORDER — ONDANSETRON HCL 4 MG/2ML IJ SOLN
INTRAMUSCULAR | Status: AC
  Filled 2022-09-15: qty 2

## 2022-09-15 MED ORDER — BUPIVACAINE HCL (PF) 0.5 % IJ SOLN
INTRAMUSCULAR | Status: DC | PRN
  Administered 2022-09-15: 20 mL via PERINEURAL

## 2022-09-15 MED ORDER — DEXAMETHASONE SODIUM PHOSPHATE 10 MG/ML IJ SOLN
INTRAMUSCULAR | Status: AC
  Filled 2022-09-15: qty 1

## 2022-09-15 MED ORDER — CYCLOBENZAPRINE HCL 10 MG PO TABS: 10.0000 mg | ORAL_TABLET | Freq: Three times a day (TID) | ORAL | 1 refills | Status: AC | PRN

## 2022-09-15 MED ORDER — LACTATED RINGERS IV SOLN: INTRAVENOUS | Status: DC

## 2022-09-15 MED ORDER — ROCURONIUM BROMIDE 10 MG/ML (PF) SYRINGE
PREFILLED_SYRINGE | INTRAVENOUS | Status: DC | PRN
  Administered 2022-09-15: 60 mg via INTRAVENOUS

## 2022-09-15 MED ORDER — FENTANYL CITRATE PF 50 MCG/ML IJ SOSY
PREFILLED_SYRINGE | INTRAMUSCULAR | Status: AC
  Filled 2022-09-15: qty 2

## 2022-09-15 MED ORDER — ONDANSETRON HCL 4 MG PO TABS: 4.0000 mg | ORAL_TABLET | Freq: Three times a day (TID) | ORAL | 0 refills | Status: AC | PRN

## 2022-09-15 MED ORDER — DEXAMETHASONE SODIUM PHOSPHATE 10 MG/ML IJ SOLN
INTRAMUSCULAR | Status: DC | PRN
  Administered 2022-09-15: 8 mg via INTRAVENOUS

## 2022-09-15 MED ORDER — METOCLOPRAMIDE HCL 5 MG PO TABS: 5.0000 mg | ORAL_TABLET | Freq: Three times a day (TID) | ORAL | Status: DC | PRN

## 2022-09-15 MED ORDER — VANCOMYCIN HCL 1000 MG IV SOLR
INTRAVENOUS | Status: AC
  Filled 2022-09-15: qty 20

## 2022-09-15 MED ORDER — FENTANYL CITRATE PF 50 MCG/ML IJ SOSY
100.0000 ug | PREFILLED_SYRINGE | Freq: Once | INTRAMUSCULAR | Status: AC
  Administered 2022-09-15: 100 ug via INTRAVENOUS

## 2022-09-15 SURGICAL SUPPLY — 76 items
ADH SKN CLS APL DERMABOND .7 (GAUZE/BANDAGES/DRESSINGS) ×1
AID PSTN UNV HD RSTRNT DISP (MISCELLANEOUS) ×1
BAG COUNTER SPONGE SURGICOUNT (BAG) IMPLANT
BAG SPEC THK2 15X12 ZIP CLS (MISCELLANEOUS) ×1
BAG SPNG CNTER NS LX DISP (BAG) ×1
BAG ZIPLOCK 12X15 (MISCELLANEOUS) ×1 IMPLANT
BLADE SAW SGTL 83.5X18.5 (BLADE) ×1 IMPLANT
BNDG CMPR 5X4 CHSV STRCH STRL (GAUZE/BANDAGES/DRESSINGS) ×1
BNDG COHESIVE 4X5 TAN STRL LF (GAUZE/BANDAGES/DRESSINGS) ×1 IMPLANT
BSPLAT GLND +2X24 MDLR (Joint) ×1 IMPLANT
COOLER ICEMAN CLASSIC (MISCELLANEOUS) ×1 IMPLANT
COVER BACK TABLE 60X90IN (DRAPES) ×1 IMPLANT
COVER SURGICAL LIGHT HANDLE (MISCELLANEOUS) ×1 IMPLANT
CUP SUT UNIV REVERS 36 NEUTRAL (Cup) IMPLANT
DERMABOND ADVANCED .7 DNX12 (GAUZE/BANDAGES/DRESSINGS) ×1 IMPLANT
DRAPE INCISE IOBAN 66X45 STRL (DRAPES) IMPLANT
DRAPE ORTHO SPLIT 77X108 STRL (DRAPES) ×2
DRAPE SHEET LG 3/4 BI-LAMINATE (DRAPES) ×1 IMPLANT
DRAPE SURG 17X11 SM STRL (DRAPES) ×1 IMPLANT
DRAPE SURG ORHT 6 SPLT 77X108 (DRAPES) ×2 IMPLANT
DRAPE TOP 10253 STERILE (DRAPES) ×1 IMPLANT
DRAPE U-SHAPE 47X51 STRL (DRAPES) ×1 IMPLANT
DRESSING AQUACEL AG SP 3.5X6 (GAUZE/BANDAGES/DRESSINGS) ×1 IMPLANT
DRSG AQUACEL AG ADV 3.5X 6 (GAUZE/BANDAGES/DRESSINGS) IMPLANT
DRSG AQUACEL AG ADV 3.5X10 (GAUZE/BANDAGES/DRESSINGS) IMPLANT
DRSG AQUACEL AG SP 3.5X6 (GAUZE/BANDAGES/DRESSINGS) ×1
DRSG TEGADERM 8X12 (GAUZE/BANDAGES/DRESSINGS) ×1 IMPLANT
DURAPREP 26ML APPLICATOR (WOUND CARE) ×1 IMPLANT
ELECT BLADE TIP CTD 4 INCH (ELECTRODE) ×1 IMPLANT
ELECT PENCIL ROCKER SW 15FT (MISCELLANEOUS) ×1 IMPLANT
ELECT REM PT RETURN 15FT ADLT (MISCELLANEOUS) ×1 IMPLANT
FACESHIELD WRAPAROUND (MASK) ×4 IMPLANT
FACESHIELD WRAPAROUND OR TEAM (MASK) ×4 IMPLANT
GLENOID UNI REV MOD 24 +2 LAT (Joint) IMPLANT
GLENOSPHERE 36 +4 LAT/24 (Joint) IMPLANT
GLOVE BIO SURGEON STRL SZ7.5 (GLOVE) ×1 IMPLANT
GLOVE BIO SURGEON STRL SZ8 (GLOVE) ×1 IMPLANT
GLOVE SS BIOGEL STRL SZ 7 (GLOVE) ×1 IMPLANT
GLOVE SS BIOGEL STRL SZ 7.5 (GLOVE) ×1 IMPLANT
GOWN STRL REIN XL XLG (GOWN DISPOSABLE) ×2 IMPLANT
INSERT HUMERAL UNI REVERS 36 3 (Insert) IMPLANT
KIT BASIN OR (CUSTOM PROCEDURE TRAY) ×1 IMPLANT
KIT TURNOVER KIT A (KITS) IMPLANT
LINER HUMERAL 36 +3MM SM (Shoulder) IMPLANT
MANIFOLD NEPTUNE II (INSTRUMENTS) ×1 IMPLANT
NDL TAPERED W/ NITINOL LOOP (MISCELLANEOUS) ×1 IMPLANT
NEEDLE TAPERED W/ NITINOL LOOP (MISCELLANEOUS) ×1 IMPLANT
NS IRRIG 1000ML POUR BTL (IV SOLUTION) ×1 IMPLANT
PACK SHOULDER (CUSTOM PROCEDURE TRAY) ×1 IMPLANT
PAD ARMBOARD 7.5X6 YLW CONV (MISCELLANEOUS) ×1 IMPLANT
PAD COLD SHLDR WRAP-ON (PAD) ×1 IMPLANT
PIN NITINOL TARGETER 2.8 (PIN) IMPLANT
PIN SET MODULAR GLENOID SYSTEM (PIN) IMPLANT
RESTRAINT HEAD UNIVERSAL NS (MISCELLANEOUS) ×1 IMPLANT
SCREW CENTRAL MODULAR 25 (Screw) IMPLANT
SCREW PERI LOCK 5.5X16 (Screw) IMPLANT
SCREW PERI LOCK 5.5X32 (Screw) IMPLANT
SCREW PERIPHERAL 5.5X20 LOCK (Screw) IMPLANT
SLING ARM FOAM STRAP LRG (SOFTGOODS) IMPLANT
SLING ARM FOAM STRAP MED (SOFTGOODS) IMPLANT
SPONGE T-LAP 4X18 ~~LOC~~+RFID (SPONGE) ×1 IMPLANT
STEM HUMERAL MOD SZ 5 135 DEG (Stem) IMPLANT
SUCTION FRAZIER HANDLE 12FR (TUBING)
SUCTION TUBE FRAZIER 12FR DISP (TUBING) ×1 IMPLANT
SUT FIBERWIRE #2 38 T-5 BLUE (SUTURE)
SUT MNCRL AB 3-0 PS2 18 (SUTURE) ×1 IMPLANT
SUT MON AB 2-0 CT1 36 (SUTURE) ×1 IMPLANT
SUT VIC AB 1 CT1 36 (SUTURE) ×1 IMPLANT
SUTURE FIBERWR #2 38 T-5 BLUE (SUTURE) IMPLANT
SUTURE TAPE 1.3 40 TPR END (SUTURE) ×2 IMPLANT
SUTURETAPE 1.3 40 TPR END (SUTURE) ×2
TOWEL OR 17X26 10 PK STRL BLUE (TOWEL DISPOSABLE) ×1 IMPLANT
TOWEL OR NON WOVEN STRL DISP B (DISPOSABLE) ×1 IMPLANT
TUBE SUCTION HIGH CAP CLEAR NV (SUCTIONS) ×1 IMPLANT
WATER STERILE IRR 1000ML POUR (IV SOLUTION) ×2 IMPLANT
YANKAUER SUCT BULB TIP 10FT TU (MISCELLANEOUS) IMPLANT

## 2022-09-15 NOTE — Op Note (Signed)
09/15/2022  4:54 PM  PATIENT:   Morgan Johnston  66 y.o. female  PRE-OPERATIVE DIAGNOSIS:  Right shoulder rotator cuff tear arthropathy  POST-OPERATIVE DIAGNOSIS: Same  PROCEDURE: Right shoulder reverse arthroplasty utilizing a press-fit size 5 Arthrex stem with a neutral metaphysis, +3 constrained polyethylene insert, 36/+4 glenosphere and a small/+2 baseplate  SURGEON:  Dannika Hilgeman, Metta Clines M.D.  ASSISTANTS: Jenetta Loges, PA-C  Jenetta Loges, PA-C was utilized as an Environmental consultant throughout this case, essential for help with positioning the patient, positioning extremity, tissue manipulation, implantation of the prosthesis, suture management, wound closure, and intraoperative decision-making.  ANESTHESIA:   General endotracheal and interscalene block with Exparel  EBL: 200 cc  SPECIMEN: None  Drains: None   PATIENT DISPOSITION:  PACU - hemodynamically stable.    PLAN OF CARE: Discharge to home after PACU  Brief history:  Morgan Johnston is a 66 year old female status post a right shoulder rotator cuff repair that we performed approximately 8 months ago and unfortunately has continued to have right shoulder pain postoperatively with a significant increase recently.  Subsequent MRI scan demonstrated a large recurrent retracted rotator cuff tear with significant fatty infiltration and atrophy of the rotator cuff musculature.  Due to her ongoing pain and functional mentation she is brought to the operating room at this time for planned right shoulder reverse arthroplasty.  Preoperatively, I counseled the patient regarding treatment options and risks versus benefits thereof.  Possible surgical complications were all reviewed including potential for bleeding, infection, neurovascular injury, persistent pain, loss of motion, anesthetic complication, failure of the implant, and possible need for additional surgery. They understand and accept and agrees with our planned procedure.   Procedure  detail:  After undergoing routine preop evaluation the patient received prophylactic antibiotics and interscalene block with Exparel was established in the holding area by the anesthesia department.  Subsequently placed supine on the operating table and underwent the smooth induction of a general endotracheal anesthesia.  Placed into the beachchair position and appropriately padded and protected.  The right shoulder girdle region was sterilely prepped and draped in standard fashion.  Timeout was called.  A deltopectoral approach to the right shoulder is made through an approximately 8 cm incision.  Skin flaps were elevated dissection carried deeply the deltopectoral interval was developed from proximal to distal with the vein taken laterally.  Adhesions were divided beneath the deltoid and the conjoined tendon was mobilized and retracted medially.  The long head biceps tendon was then tenodesed at the upper border the pectoralis major tendon the proximal segment was excised and we did performed a previous biceps tenodesis at the apex of the bicipital groove and this aspect was excised along with a suture anchor.  The remnant of the rotator cuff was split from the apex of the bicipital groove to the base of the coracoid and the subscap was elevated from most tuberosity with electrocautery and was then tagged with a pair of suture tape sutures.  Capsular attachments were then divided from the anterior and inferior margins of the humeral neck and the humeral head was then delivered through the wound.  An extra medullary guide was then used to outline the proposed humeral head resection which we performed with an oscillating saw at approximately 20 degrees retroversion.  We then placed a metal cap over the cut proximal humeral surface staff for removing some marginal osteophytes and retained suture anchors within the humeral metaphysis.  The glenoid was then exposed and a circumferential labral resection was  performed.  A guidepin was then directed into the center of the glenoid and the glenoid was then reamed with the central followed by the peripheral reamer in preparation completed with the central drill and tapped for a 25 mm lag screw.  Our baseplate was then assembled and inserted with some vancomycin powder applied to the threads of the lag screw with excellent purchase and fixation achieved.  All of the peripheral locking screws were then placed using standard technique with excellent purchase and fixation.  A 36/+4 glenosphere was then impacted onto the baseplate and the central locking screw was placed.  Our attention was then returned back to the proximal humerus where the canal was opened using hand reaming we identified additional retained suture anchors which were then removed.  We ultimately broached to a size 5 at approximate 20 degrees retroversion.  A neutral metaphyseal reaming guide was then used to prepare the metaphysis.  Trial implant was placed which allowed reduction showed good motion good stability and good soft tissue balance all much to our satisfaction.  Trial was then removed.  Our final implant was assembled.  Vancomycin powder was applied to the humeral canal we also harvested bone graft from the resected humeral head and the implant was then inserted with bone graft placed about the metaphysis and it was then terminally seated with excellent fit and fixation.  Trial reduction at this point showed good soft tissue balance with the +3 poly-.  We ultimately selected a +3 constrained poly.  The final +3 constrained poly was then impacted onto the implant and the final reduction was then performed which showed good motion good stability good soft tissue balance all much to our satisfaction.  The wound was then copious irrigated.  Final hemostasis was obtained.  Balance of the vancomycin powder was then spread liberally throughout the deep soft tissue planes.  Subscapularis was confirmed to  have good elasticity and was repaired back to the eyelets on the collar of her implant using the previously placed suture tape sutures.  The deltopectoral interval was reapproximated with a series of figure-of-eight #1 Vicryl sutures.  2-0 Monocryl used to close the subcu layer and intracuticular 3-0 Monocryl used to close the skin followed by Dermabond and Aquacel dressing.  The right arm was then placed into a sling and the patient was awakened, extubated, and taken to the recovery in stable condition.  Marin Shutter MD   Contact # [phone]

## 2022-09-15 NOTE — Discharge Instructions (Signed)

## 2022-09-15 NOTE — Anesthesia Postprocedure Evaluation (Signed)
Anesthesia Post Note  Patient: Morgan Johnston  Procedure(s) Performed: REVERSE SHOULDER ARTHROPLASTY (Right: Shoulder)     Patient location during evaluation: PACU Anesthesia Type: General Level of consciousness: awake and alert and oriented Pain management: pain level controlled Vital Signs Assessment: post-procedure vital signs reviewed and stable Respiratory status: spontaneous breathing, nonlabored ventilation and respiratory function stable Cardiovascular status: blood pressure returned to baseline and stable Postop Assessment: no apparent nausea or vomiting Anesthetic complications: yes   Encounter Notable Events  Notable Event Outcome Phase Comment  Difficult to intubate - expected  Intraprocedure Filed from anesthesia note documentation.    Last Vitals:  Vitals:   09/15/22 1730 09/15/22 1745  BP: 114/77 117/70  Pulse: 75 69  Resp: (!) 24 19  Temp:    SpO2: 96% 92%    Last Pain:  Vitals:   09/15/22 1745  TempSrc:   PainSc: 0-No pain                 [place] Haub A.

## 2022-09-15 NOTE — Anesthesia Procedure Notes (Signed)
Procedure Name: Intubation Date/Time: 09/15/2022 3:32 PM  Performed by: Milford Cage, CRNAPre-anesthesia Checklist: Patient identified, Emergency Drugs available, Suction available and Patient being monitored Patient Re-evaluated:Patient Re-evaluated prior to induction Oxygen Delivery Method: Circle system utilized Preoxygenation: Pre-oxygenation with 100% oxygen Induction Type: IV induction Ventilation: Mask ventilation without difficulty Laryngoscope Size: Glidescope and 3 Grade View: Grade I Tube type: Oral Tube size: 7.5 mm Number of attempts: 1 Airway Equipment and Method: Rigid stylet and Video-laryngoscopy Placement Confirmation: ETT inserted through vocal cords under direct vision, positive ETCO2 and breath sounds checked- equal and bilateral Secured at: 21 cm Tube secured with: Tape Dental Injury: Teeth and Oropharynx as per pre-operative assessment  Difficulty Due To: Difficulty was anticipated and Difficult Airway- due to reduced neck mobility

## 2022-09-15 NOTE — Anesthesia Preprocedure Evaluation (Signed)
Anesthesia Evaluation  Patient identified by MRN, date of birth, ID band Patient awake    Reviewed: Allergy & Precautions, NPO status , Patient's Chart, lab work & pertinent test results, reviewed documented beta blocker date and time   Airway Mallampati: II  TM Distance: >3 FB Neck ROM: Full    Dental   Pulmonary asthma , pneumonia, resolved,    Pulmonary exam normal breath sounds clear to auscultation       Cardiovascular negative cardio ROS Normal cardiovascular exam Rhythm:Regular Rate:Normal     Neuro/Psych  Headaches,  Neuromuscular disease negative psych ROS   GI/Hepatic Neg liver ROS, GERD  ,  Endo/Other  negative endocrine ROS  Renal/GU negative Renal ROS  negative genitourinary   Musculoskeletal  (+) Arthritis , Osteoarthritis,  DJD right shoulder Hx/o cervical spine surgery   Abdominal   Peds  Hematology negative hematology ROS (+)   Anesthesia Other Findings   Reproductive/Obstetrics                             Anesthesia Physical Anesthesia Plan  ASA: 2  Anesthesia Plan: General   Post-op Pain Management: Regional block* and Minimal or no pain anticipated   Induction: Intravenous  PONV Risk Score and Plan: 4 or greater and Treatment may vary due to age or medical condition, Ondansetron and Dexamethasone  Airway Management Planned: Oral ETT and Video Laryngoscope Planned  Additional Equipment: None  Intra-op Plan:   Post-operative Plan: Extubation in OR  Informed Consent: I have reviewed the patients History and Physical, chart, labs and discussed the procedure including the risks, benefits and alternatives for the proposed anesthesia with the patient or authorized representative who has indicated his/her understanding and acceptance.     Dental advisory given  Plan Discussed with: Anesthesiologist and CRNA  Anesthesia Plan Comments:         Anesthesia  Quick Evaluation

## 2022-09-15 NOTE — Anesthesia Procedure Notes (Signed)
Anesthesia Regional Block: Interscalene brachial plexus block   Pre-Anesthetic Checklist: , timeout performed,  Correct Patient, Correct Site, Correct Laterality,  Correct Procedure, Correct Position, site marked,  Risks and benefits discussed,  Surgical consent,  Pre-op evaluation,  At surgeon's request and post-op pain management  Laterality: Right  Prep: chloraprep       Needles:  Injection technique: Single-shot  Needle Type: Echogenic Stimulator Needle     Needle Length: 10cm  Needle Gauge: 21   Needle insertion depth: 6 cm   Additional Needles:   Procedures:,,,, ultrasound used (permanent image in chart),,   Motor weakness within 7 minutes.  Narrative:  Start time: 09/15/2022 1:58 PM End time: 09/15/2022 2:03 PM Injection made incrementally with aspirations every 5 mL.  Performed by: Personally  Anesthesiologist: Josephine Igo, MD  Additional Notes: Timeout performed. Patient sedated. Relevant anatomy ID'd using [place]. Incremental 2-12m injection of LA with frequent aspiration. Patient tolerated procedure well.     Right Interscalene Block

## 2022-09-15 NOTE — H&P (Signed)
Morgan Johnston    Chief Complaint: Right shoulder rotator cuff tear arthropathy HPI: The patient is a 66 y.o. female with a long history of right shoulder pain, with remote right shoulder arthroscopy and rotator cuff repair which unfortunately failed to heal and she has now progressed to a painful rotator cuff tear arthropathy.  Due to her increasing functional limitations and failure to respond to prolonged attempts at conservative management, she is brought to the operating room this time for planned right shoulder reverse arthroplasty  Past Medical History:  Diagnosis Date   Arthritis    Asthma    CAUSED BY ACID REFLUX   Back pain    DDD   Bronchitis 2012   Cancer ([place])    Constipation    Dizziness    r/t menstrual cycle;was given Meclizine and no problems in over a yr   GERD (gastroesophageal reflux disease) 2013   Headache(784.0)    occasionally   Hx of seasonal allergies    takes Allegra daily   Hyperlipidemia    takes Simvastatin nightly   Insomnia    d/t neck pain and menopause   Joint pain    Joint swelling    Pneumonia 2012      Past Surgical History:  Procedure Laterality Date   ANTERIOR CERVICAL DECOMP/DISCECTOMY FUSION  06/07/2012   Procedure: ANTERIOR CERVICAL DECOMPRESSION/DISCECTOMY FUSION 2 LEVELS;  Surgeon: Melina Schools, MD;  Location: [place];  Service: Orthopedics;  Laterality: N/A;  ACDF C4-5, 5-6   ANTERIOR CERVICAL DECOMP/DISCECTOMY FUSION N/A 05/27/2020   Procedure: ANTERIOR CERVICAL DECOMPRESSION/DISCECTOMY FUSION CERVICAL SIX-SEVEN;  Surgeon: Melina Schools, MD;  Location: [place];  Service: Orthopedics;  Laterality: N/A;  2.5 hrs   CARPAL TUNNEL RELEASE     bilateral   COLONOSCOPY     COLONOSCOPY WITH PROPOFOL N/A 08/15/2019   Procedure: COLONOSCOPY WITH PROPOFOL;  Surgeon: Laurence Spates, MD;  Location: [facility];  Service: Endoscopy;  Laterality: N/A;   EYE SURGERY Bilateral    CATARACT SURGERY   FLEXIBLE SIGMOIDOSCOPY  at age 60   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8   JOINT REPLACEMENT  2019   LEFT HAND [place] JOINT REPLACMENT   LEFT HAND CYST REMOVED  2019   ROTATOR CUFF REPAIR Right 12/2021   TONSILLECTOMY     at age 88    Family History  Problem Relation Age of Onset   Colon polyps Father    Diabetes Father    Kidney disease Father 76       Renal Cell carcinoma   Hypertension Father    High Cholesterol Father    Diabetes Mother    Allergic rhinitis Mother    Asthma Mother    Hypertension Mother    High Cholesterol Mother    Colon cancer Sister    Diabetes Brother    Allergic rhinitis Brother    Esophageal cancer Neg Hx     Social History:  reports that she has never smoked. She has never used smokeless tobacco. She reports current alcohol use. She reports that she does not use drugs.  BMI: Estimated body mass index is 25.85 kg/m as calculated from the following:   Height as of this encounter: '4\' 11"'$  (1.499 m).   Weight as of this encounter: 58.1 kg.  No results found for: "ALBUMIN" Diabetes: Patient does not have a diagnosis of diabetes.     Smoking Status:   reports that she has never smoked. She has  never used smokeless tobacco.     Medications Prior to Admission  Medication Sig Dispense Refill   acetaminophen (TYLENOL) 500 MG tablet Take 500 mg by mouth every 6 (six) hours as needed for moderate pain.     albuterol (VENTOLIN HFA) 108 (90 Base) MCG/ACT inhaler Inhale 2 puffs into the lungs every 6 (six) hours as needed for wheezing or shortness of breath.     Calcium Carb-Cholecalciferol (CALCIUM 600 + D PO) Take 1 tablet by mouth daily.     celecoxib (CELEBREX) 200 MG capsule Take 200 mg by mouth 2 (two) times daily.     Cholecalciferol (DIALYVITE VITAMIN D 5000) 125 MCG (5000 UT) capsule Take 5,000 Units by mouth daily.     fexofenadine (ALLEGRA) 180 MG tablet Take 180 mg by mouth daily.     neomycin-polymyxin-pramoxine (NEOSPORIN PLUS) 1 % cream Apply 1 Application  topically 2 (two) times daily as needed (wound care).     PARoxetine (PAXIL) 10 MG tablet Take 10 mg by mouth daily.     rosuvastatin (CRESTOR) 5 MG tablet Take 5 mg by mouth daily.     Teriparatide, Recombinant, (FORTEO) 600 MCG/2.4ML SOPN Inject 20 mcg into the skin daily.       Physical Exam: Right shoulder demonstrates well-healed previous arthroscopy portals.  She has moderate diffuse swelling.  No erythema or induration.  Diffuse tenderness.  Markedly restricted motion with severe pain and global weakness to manual muscle testing.  Examination otherwise as documented at her recent office visits.  Radiographs  Plain films show changes consistent with previous arthroscopic surgery and evidence for prior distal clavicle resection and subacromial decompression.  Recent MRI scan confirms a large recurrent tear of the entire superior rotator cuff with significant retraction as well as fatty infiltration and atrophy of the rotator cuff musculature.  Vitals  Temp:  [98 F (36.7 C)] 98 F (36.7 C) (09/28 1310) Pulse Rate:  [76-87] 87 (09/28 1415) Resp:  [15-20] 20 (09/28 1415) BP: (126-137)/(52-72) 137/72 (09/28 1415) SpO2:  [97 %-100 %] 99 % (09/28 1415) Weight:  [58.1 kg] 58.1 kg (09/28 1313)  Assessment/Plan  Impression: Right shoulder rotator cuff tear arthropathy  Plan of Action: Procedure(s): REVERSE SHOULDER ARTHROPLASTY  Morgan Johnston M Morgan Johnston 09/15/2022, 2:29 PM Contact # [phone]

## 2022-09-15 NOTE — Transfer of Care (Signed)
Immediate Anesthesia Transfer of Care Note  Patient: Morgan Johnston  Procedure(s) Performed: REVERSE SHOULDER ARTHROPLASTY (Right: Shoulder)  Patient Location: PACU  Anesthesia Type:General  Level of Consciousness: drowsy  Airway & Oxygen Therapy: Patient Spontanous Breathing and Patient connected to face mask oxygen  Post-op Assessment: Report given to RN and Post -op Vital signs reviewed and stable  Post vital signs: Reviewed and stable  Last Vitals:  Vitals Value Taken Time  BP 129/76 09/15/22 1718  Temp    Pulse 74 09/15/22 1718  Resp 18 09/15/22 1718  SpO2 97 % 09/15/22 1718  Vitals shown include unvalidated device data.  Last Pain:  Vitals:   09/15/22 1415  TempSrc:   PainSc: 0-No pain         Complications:  Encounter Notable Events  Notable Event Outcome Phase Comment  Difficult to intubate - expected  Intraprocedure Filed from anesthesia note documentation.

## 2022-09-15 NOTE — Evaluation (Signed)
Occupational Therapy Evaluation Patient Details Name: Morgan Johnston MRN: [id number] DOB: Feb 13, 1956 Today's Date: 09/15/2022   History of Present Illness Patient is a 66 year old woman who presents for planned reverse shoulder arthroplasty.   Clinical Impression   [name] is a 66 year old woman currently in pre-op area awaiting above surgery. Therapist provided education and instruction to patient and spouse in regards to exercises, precautions, positioning, donning upper extremity clothing and bathing while maintaining shoulder precautions, ice and edema management and donning/doffing sling. Patient and spouse verbalized understanding and demonstrated as needed. Handouts provided to patient's husband to maximize retention of education. Patient to follow up with MD for further therapy needs.        Recommendations for follow up therapy are one component of a multi-disciplinary discharge planning process, led by the attending physician.  Recommendations may be updated based on patient status, additional functional criteria and insurance authorization.   Follow Up Recommendations  Follow physician's recommendations for discharge plan and follow up therapies    Assistance Recommended at Discharge Intermittent Supervision/Assistance  Patient can return home with the following A little help with bathing/dressing/bathroom;Assistance with cooking/housework    Functional Status Assessment  Patient has had a recent decline in their functional status and demonstrates the ability to make significant improvements in function in a reasonable and predictable amount of time.  Equipment Recommendations  None recommended by OT    Recommendations for Other Services       Precautions / Restrictions Precautions Precautions: Shoulder Type of Shoulder Precautions: If sitting in controlled environment, ok to come out of sling to give neck a break. Please sleep in it to protect until follow up in  office.     OK to use operative arm for feeding, hygiene and ADLs.   Ok to instruct Pendulums and lap slides as exercises. Ok to use operative arm within the following parameters for ADL purposes     New ROM (8/18)   Ok for PROM, AAROM, AROM within pain tolerance and within the following ROM   ER 20   ABD 45   FE 60      Mobility Bed Mobility                    Transfers                          Balance                                           ADL either performed or assessed with clinical judgement   ADL                                               Vision         Perception     Praxis      Pertinent Vitals/Pain Pain Assessment Pain Assessment: No/denies pain     Hand Dominance     Extremity/Trunk Assessment             Communication     Cognition Arousal/Alertness: Awake/alert Behavior During Therapy: WFL for tasks assessed/performed Overall Cognitive Status: Within Functional Limits for tasks assessed  General Comments       Exercises     Shoulder Instructions Shoulder Instructions Donning/doffing shirt without moving shoulder: Patient able to independently direct caregiver Method for sponge bathing under operated UE: Patient able to independently direct caregiver Donning/doffing sling/immobilizer: Patient able to independently direct caregiver Correct positioning of sling/immobilizer: Patient able to independently direct caregiver Pendulum exercises (written home exercise program): Patient able to independently direct caregiver ROM for elbow, wrist and digits of operated UE: Patient able to independently direct caregiver Sling wearing schedule (on at all times/off for ADL's): Patient able to independently direct caregiver Proper positioning of operated UE when showering: Patient able to independently direct caregiver Dressing change: Patient  able to independently direct caregiver Positioning of UE while sleeping: Patient able to independently direct caregiver    Home Living Family/patient expects to be discharged to:: Private residence Living Arrangements: Spouse/significant other                                      Prior Functioning/Environment                          OT Problem List: Decreased strength;Decreased range of motion;Impaired UE functional use;Pain      OT Treatment/Interventions:      OT Goals(Current goals can be found in the care plan section) Acute Rehab OT Goals OT Goal Formulation: All assessment and education complete, DC therapy  OT Frequency:      Co-evaluation              AM-PAC OT "6 Clicks" Daily Activity     Outcome Measure Help from another person eating meals?: None Help from another person taking care of personal grooming?: None Help from another person toileting, which includes using toliet, bedpan, or urinal?: None Help from another person bathing (including washing, rinsing, drying)?: None Help from another person to put on and taking off regular upper body clothing?: None Help from another person to put on and taking off regular lower body clothing?: None 6 Click Score: 24   End of Session Nurse Communication:  (patient ready for OT)  Activity Tolerance: Patient tolerated treatment well Patient left: in bed;with call bell/phone within reach;with family/visitor present  OT Visit Diagnosis: Pain                Time: 3536-1443 OT Time Calculation (min): 14 min Charges:  OT General Charges $OT Visit: 1 Visit OT Evaluation $OT Eval Low Complexity: 1 Low  Gustavo Lah, OTR/L [place]  Office [phone]   Lenward Chancellor 09/15/2022, 4:04 PM

## 2022-09-16 ENCOUNTER — Encounter (HOSPITAL_COMMUNITY): Payer: Self-pay | Admitting: Orthopedic Surgery

## 2022-12-21 DIAGNOSIS — M25511 Pain in right shoulder: Secondary | ICD-10-CM | POA: Diagnosis not present

## 2022-12-21 DIAGNOSIS — M25611 Stiffness of right shoulder, not elsewhere classified: Secondary | ICD-10-CM | POA: Diagnosis not present

## 2022-12-28 DIAGNOSIS — M25511 Pain in right shoulder: Secondary | ICD-10-CM | POA: Diagnosis not present

## 2022-12-28 DIAGNOSIS — M25611 Stiffness of right shoulder, not elsewhere classified: Secondary | ICD-10-CM | POA: Diagnosis not present

## 2023-01-04 DIAGNOSIS — M25511 Pain in right shoulder: Secondary | ICD-10-CM | POA: Diagnosis not present

## 2023-01-04 DIAGNOSIS — M25611 Stiffness of right shoulder, not elsewhere classified: Secondary | ICD-10-CM | POA: Diagnosis not present

## 2023-01-06 DIAGNOSIS — M25511 Pain in right shoulder: Secondary | ICD-10-CM | POA: Diagnosis not present

## 2023-01-06 DIAGNOSIS — M25611 Stiffness of right shoulder, not elsewhere classified: Secondary | ICD-10-CM | POA: Diagnosis not present

## 2023-01-11 DIAGNOSIS — M25511 Pain in right shoulder: Secondary | ICD-10-CM | POA: Diagnosis not present

## 2023-01-11 DIAGNOSIS — M25611 Stiffness of right shoulder, not elsewhere classified: Secondary | ICD-10-CM | POA: Diagnosis not present

## 2023-01-27 DIAGNOSIS — M25611 Stiffness of right shoulder, not elsewhere classified: Secondary | ICD-10-CM | POA: Diagnosis not present

## 2023-01-27 DIAGNOSIS — M25511 Pain in right shoulder: Secondary | ICD-10-CM | POA: Diagnosis not present

## 2023-02-06 DIAGNOSIS — X32XXXD Exposure to sunlight, subsequent encounter: Secondary | ICD-10-CM | POA: Diagnosis not present

## 2023-02-06 DIAGNOSIS — L570 Actinic keratosis: Secondary | ICD-10-CM | POA: Diagnosis not present

## 2023-03-06 DIAGNOSIS — E559 Vitamin D deficiency, unspecified: Secondary | ICD-10-CM | POA: Diagnosis not present

## 2023-03-06 DIAGNOSIS — M4850XA Collapsed vertebra, not elsewhere classified, site unspecified, initial encounter for fracture: Secondary | ICD-10-CM | POA: Diagnosis not present

## 2023-03-06 DIAGNOSIS — M810 Age-related osteoporosis without current pathological fracture: Secondary | ICD-10-CM | POA: Diagnosis not present

## 2023-03-09 DIAGNOSIS — S83422A Sprain of lateral collateral ligament of left knee, initial encounter: Secondary | ICD-10-CM | POA: Diagnosis not present

## 2023-03-09 DIAGNOSIS — M25562 Pain in left knee: Secondary | ICD-10-CM | POA: Diagnosis not present

## 2023-03-10 ENCOUNTER — Other Ambulatory Visit: Payer: Self-pay

## 2023-03-10 DIAGNOSIS — M810 Age-related osteoporosis without current pathological fracture: Secondary | ICD-10-CM | POA: Insufficient documentation

## 2023-03-13 DIAGNOSIS — Z471 Aftercare following joint replacement surgery: Secondary | ICD-10-CM | POA: Diagnosis not present

## 2023-03-13 DIAGNOSIS — Z96611 Presence of right artificial shoulder joint: Secondary | ICD-10-CM | POA: Diagnosis not present

## 2023-04-11 DIAGNOSIS — M810 Age-related osteoporosis without current pathological fracture: Secondary | ICD-10-CM | POA: Diagnosis not present

## 2023-04-28 DIAGNOSIS — Z01419 Encounter for gynecological examination (general) (routine) without abnormal findings: Secondary | ICD-10-CM | POA: Diagnosis not present

## 2023-05-16 DIAGNOSIS — M25511 Pain in right shoulder: Secondary | ICD-10-CM | POA: Diagnosis not present

## 2023-05-16 DIAGNOSIS — I2510 Atherosclerotic heart disease of native coronary artery without angina pectoris: Secondary | ICD-10-CM | POA: Diagnosis not present

## 2023-05-16 DIAGNOSIS — J4520 Mild intermittent asthma, uncomplicated: Secondary | ICD-10-CM | POA: Diagnosis not present

## 2023-05-16 DIAGNOSIS — M810 Age-related osteoporosis without current pathological fracture: Secondary | ICD-10-CM | POA: Diagnosis not present

## 2023-05-16 DIAGNOSIS — Z0000 Encounter for general adult medical examination without abnormal findings: Secondary | ICD-10-CM | POA: Diagnosis not present

## 2023-05-16 DIAGNOSIS — M25611 Stiffness of right shoulder, not elsewhere classified: Secondary | ICD-10-CM | POA: Diagnosis not present

## 2023-05-16 DIAGNOSIS — E782 Mixed hyperlipidemia: Secondary | ICD-10-CM | POA: Diagnosis not present

## 2023-05-29 DIAGNOSIS — M545 Low back pain, unspecified: Secondary | ICD-10-CM | POA: Diagnosis not present

## 2023-05-30 DIAGNOSIS — M25611 Stiffness of right shoulder, not elsewhere classified: Secondary | ICD-10-CM | POA: Diagnosis not present

## 2023-06-01 DIAGNOSIS — Z1231 Encounter for screening mammogram for malignant neoplasm of breast: Secondary | ICD-10-CM | POA: Diagnosis not present

## 2023-06-19 DIAGNOSIS — M25611 Stiffness of right shoulder, not elsewhere classified: Secondary | ICD-10-CM | POA: Diagnosis not present

## 2023-06-19 DIAGNOSIS — M25511 Pain in right shoulder: Secondary | ICD-10-CM | POA: Diagnosis not present

## 2023-06-28 DIAGNOSIS — M545 Low back pain, unspecified: Secondary | ICD-10-CM | POA: Diagnosis not present

## 2023-07-03 DIAGNOSIS — M25511 Pain in right shoulder: Secondary | ICD-10-CM | POA: Diagnosis not present

## 2023-07-05 DIAGNOSIS — M545 Low back pain, unspecified: Secondary | ICD-10-CM | POA: Diagnosis not present

## 2023-07-11 DIAGNOSIS — Z96611 Presence of right artificial shoulder joint: Secondary | ICD-10-CM | POA: Diagnosis not present

## 2023-07-11 DIAGNOSIS — Z471 Aftercare following joint replacement surgery: Secondary | ICD-10-CM | POA: Diagnosis not present

## 2023-07-17 DIAGNOSIS — M545 Low back pain, unspecified: Secondary | ICD-10-CM | POA: Diagnosis not present

## 2023-07-26 DIAGNOSIS — Z471 Aftercare following joint replacement surgery: Secondary | ICD-10-CM | POA: Diagnosis not present

## 2023-07-26 DIAGNOSIS — Z96611 Presence of right artificial shoulder joint: Secondary | ICD-10-CM | POA: Diagnosis not present

## 2023-09-04 DIAGNOSIS — Z96612 Presence of left artificial shoulder joint: Secondary | ICD-10-CM | POA: Diagnosis not present

## 2023-09-04 DIAGNOSIS — Z96611 Presence of right artificial shoulder joint: Secondary | ICD-10-CM | POA: Diagnosis not present

## 2023-09-04 DIAGNOSIS — M25512 Pain in left shoulder: Secondary | ICD-10-CM | POA: Diagnosis not present

## 2023-09-07 ENCOUNTER — Other Ambulatory Visit (HOSPITAL_COMMUNITY): Payer: Self-pay | Admitting: Orthopedic Surgery

## 2023-09-07 DIAGNOSIS — Z96611 Presence of right artificial shoulder joint: Secondary | ICD-10-CM

## 2023-09-11 ENCOUNTER — Encounter: Payer: Self-pay | Admitting: Endocrinology

## 2023-09-13 DIAGNOSIS — Z96611 Presence of right artificial shoulder joint: Secondary | ICD-10-CM | POA: Diagnosis not present

## 2023-09-18 ENCOUNTER — Encounter (HOSPITAL_COMMUNITY): Payer: Self-pay

## 2023-09-18 ENCOUNTER — Other Ambulatory Visit (HOSPITAL_COMMUNITY): Payer: Medicare Other

## 2023-09-19 DIAGNOSIS — M4850XA Collapsed vertebra, not elsewhere classified, site unspecified, initial encounter for fracture: Secondary | ICD-10-CM | POA: Diagnosis not present

## 2023-09-19 DIAGNOSIS — E559 Vitamin D deficiency, unspecified: Secondary | ICD-10-CM | POA: Diagnosis not present

## 2023-09-19 DIAGNOSIS — M810 Age-related osteoporosis without current pathological fracture: Secondary | ICD-10-CM | POA: Diagnosis not present

## 2023-09-23 DIAGNOSIS — M546 Pain in thoracic spine: Secondary | ICD-10-CM | POA: Diagnosis not present

## 2023-09-26 DIAGNOSIS — M546 Pain in thoracic spine: Secondary | ICD-10-CM | POA: Diagnosis not present

## 2023-09-27 ENCOUNTER — Encounter (HOSPITAL_COMMUNITY): Payer: Medicare Other

## 2023-09-29 DIAGNOSIS — M545 Low back pain, unspecified: Secondary | ICD-10-CM | POA: Diagnosis not present

## 2023-09-29 DIAGNOSIS — M546 Pain in thoracic spine: Secondary | ICD-10-CM | POA: Diagnosis not present

## 2023-10-02 DIAGNOSIS — R739 Hyperglycemia, unspecified: Secondary | ICD-10-CM | POA: Diagnosis not present

## 2023-10-02 DIAGNOSIS — D649 Anemia, unspecified: Secondary | ICD-10-CM | POA: Diagnosis not present

## 2023-10-02 DIAGNOSIS — Z23 Encounter for immunization: Secondary | ICD-10-CM | POA: Diagnosis not present

## 2023-10-02 DIAGNOSIS — M810 Age-related osteoporosis without current pathological fracture: Secondary | ICD-10-CM | POA: Diagnosis not present

## 2023-10-02 DIAGNOSIS — E782 Mixed hyperlipidemia: Secondary | ICD-10-CM | POA: Diagnosis not present

## 2023-10-02 DIAGNOSIS — E559 Vitamin D deficiency, unspecified: Secondary | ICD-10-CM | POA: Diagnosis not present

## 2023-10-02 DIAGNOSIS — J4520 Mild intermittent asthma, uncomplicated: Secondary | ICD-10-CM | POA: Diagnosis not present

## 2023-10-03 ENCOUNTER — Encounter: Payer: Self-pay | Admitting: Endocrinology

## 2023-10-04 ENCOUNTER — Encounter (HOSPITAL_COMMUNITY)
Admission: RE | Admit: 2023-10-04 | Discharge: 2023-10-04 | Disposition: A | Discharge status: Home / Self Care | Payer: Medicare Other | Source: Ambulatory Visit | Attending: Orthopedic Surgery | Admitting: Orthopedic Surgery

## 2023-10-04 DIAGNOSIS — Z96611 Presence of right artificial shoulder joint: Secondary | ICD-10-CM | POA: Insufficient documentation

## 2023-10-04 DIAGNOSIS — M898X9 Other specified disorders of bone, unspecified site: Secondary | ICD-10-CM | POA: Diagnosis not present

## 2023-10-04 MED ORDER — TECHNETIUM TC 99M MEDRONATE IV KIT
20.0000 | PACK | Freq: Once | INTRAVENOUS | Status: AC | PRN
  Administered 2023-10-04: 20 via INTRAVENOUS

## 2023-10-13 DIAGNOSIS — M810 Age-related osteoporosis without current pathological fracture: Secondary | ICD-10-CM | POA: Diagnosis not present

## 2023-10-23 DIAGNOSIS — Z96611 Presence of right artificial shoulder joint: Secondary | ICD-10-CM | POA: Diagnosis not present

## 2023-10-23 DIAGNOSIS — M7542 Impingement syndrome of left shoulder: Secondary | ICD-10-CM | POA: Diagnosis not present

## 2023-11-12 DIAGNOSIS — M25512 Pain in left shoulder: Secondary | ICD-10-CM | POA: Diagnosis not present

## 2023-11-15 DIAGNOSIS — M25511 Pain in right shoulder: Secondary | ICD-10-CM | POA: Diagnosis not present

## 2023-11-20 DIAGNOSIS — M25512 Pain in left shoulder: Secondary | ICD-10-CM | POA: Diagnosis not present

## 2023-11-21 DIAGNOSIS — M545 Low back pain, unspecified: Secondary | ICD-10-CM | POA: Diagnosis not present

## 2023-11-30 ENCOUNTER — Other Ambulatory Visit: Payer: Self-pay

## 2023-11-30 ENCOUNTER — Encounter (HOSPITAL_BASED_OUTPATIENT_CLINIC_OR_DEPARTMENT_OTHER): Payer: Self-pay | Admitting: Physical Therapy

## 2023-11-30 ENCOUNTER — Ambulatory Visit (HOSPITAL_BASED_OUTPATIENT_CLINIC_OR_DEPARTMENT_OTHER): Payer: Medicare Other | Attending: Orthopedic Surgery | Admitting: Physical Therapy

## 2023-11-30 DIAGNOSIS — M5459 Other low back pain: Secondary | ICD-10-CM | POA: Diagnosis not present

## 2023-11-30 DIAGNOSIS — R2689 Other abnormalities of gait and mobility: Secondary | ICD-10-CM | POA: Insufficient documentation

## 2023-11-30 NOTE — Therapy (Signed)
OUTPATIENT PHYSICAL THERAPY THORACOLUMBAR EVALUATION   Patient Name: Morgan Johnston MRN: [id number] DOB:Jul 03, 1956, 67 y.o., female Today's Date: 11/30/2023  END OF SESSION:  PT End of Session - 11/30/23 1752     Visit Number 1    Number of Visits 12    Date for PT Re-Evaluation 01/12/24    Authorization Type BCBS medicare    Progress Note Due on Visit 10    PT Start Time 1615    PT Stop Time 1655    PT Time Calculation (min) 40 min    Activity Tolerance Patient tolerated treatment well    Behavior During Therapy WFL for tasks assessed/performed             Past Medical History:  Diagnosis Date   Arthritis    Asthma    CAUSED BY ACID REFLUX   Back pain    DDD   Bronchitis 2012   Cancer ([facility])    Constipation    Dizziness    r/t menstrual cycle;was given Meclizine and no problems in over a yr   GERD (gastroesophageal reflux disease) 2013   Headache(784.0)    occasionally   Hx of seasonal allergies    takes Allegra daily   Hyperlipidemia    takes Simvastatin nightly   Insomnia    d/t neck pain and menopause   Joint pain    Joint swelling    Pneumonia 2012   Past Surgical History:  Procedure Laterality Date   ANTERIOR CERVICAL DECOMP/DISCECTOMY FUSION  06/07/2012   Procedure: ANTERIOR CERVICAL DECOMPRESSION/DISCECTOMY FUSION 2 LEVELS;  Surgeon: Venita Lick, MD;  Location: [facility];  Service: Orthopedics;  Laterality: N/A;  ACDF C4-5, 5-6   ANTERIOR CERVICAL DECOMP/DISCECTOMY FUSION N/A 05/27/2020   Procedure: ANTERIOR CERVICAL DECOMPRESSION/DISCECTOMY FUSION CERVICAL SIX-SEVEN;  Surgeon: Venita Lick, MD;  Location: [facility];  Service: Orthopedics;  Laterality: N/A;  2.5 hrs   CARPAL TUNNEL RELEASE     bilateral   COLONOSCOPY     COLONOSCOPY WITH PROPOFOL N/A 08/15/2019   Procedure: COLONOSCOPY WITH PROPOFOL;  Surgeon: Carman Ching, MD;  Location: [facility];  Service: Endoscopy;  Laterality: N/A;   EYE SURGERY Bilateral    CATARACT SURGERY   FLEXIBLE  SIGMOIDOSCOPY  at age 11   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[place] @ age 14   JOINT REPLACEMENT  2019   LEFT HAND [facility] JOINT REPLACMENT   LEFT HAND CYST REMOVED  2019   REVERSE SHOULDER ARTHROPLASTY Right 09/15/2022   Procedure: REVERSE SHOULDER ARTHROPLASTY;  Surgeon: Francena Hanly, MD;  Location: [facility];  Service: Orthopedics;  Laterality: Right;    ROTATOR CUFF REPAIR Right 12/2021   TONSILLECTOMY     at age 89   Patient Active Problem List   Diagnosis Date Noted   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   Cervical radiculopathy 05/27/2020   Mild intermittent asthma 12/04/2015   LPRD (laryngopharyngeal reflux disease) 12/04/2015   Allergic rhinoconjunctivitis 12/04/2015   Family history of colon cancer 02/06/2015   Chest pain 01/26/2015   Dyslipidemia 01/26/2015   GERD (gastroesophageal reflux disease) 01/26/2015   Bloating 10/16/2014   Constipation 10/16/2014   Redundant colon 10/16/2014    PCP: Sigmund Hazel MD  REFERRING PROVIDER: Venita Lick MD  REFERRING DIAG: M54.50 (ICD-10-CM) - Low back pain, unspecified   Rationale for Evaluation and Treatment: Rehabilitation  THERAPY DIAG:  Other low back pain  Other abnormalities of gait and mobility  ONSET DATE: exacerbated x 1 month  SUBJECTIVE:  SUBJECTIVE STATEMENT: 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  Etiology unknown.  Took prednisone and it is better (was 7-8/10 now 3/10). When it was bad it radiated into left foot tingling. It has been going on since before Thanksgiving.  Now I have stiffness when I walk and some pain has returned.  Prednisone knocked it completely out for a few days. Have a long history of orthopedic issues have OA every where. Have had cervical surgery in  2013. No access to pool.  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  OP L1 compression fx in 2022 healed PAIN:  Are you having pain? Yes: NPRS scale: current 2/10; worst 4/10; least 0/10 Pain location: left SI area prior to steroids with radiation into lle Pain description: stiffness, sore Aggravating factors: STS; bed transfers; car transfers Relieving factors: prednisone, celebrex; heating pad  PRECAUTIONS: None  RED FLAGS: None   WEIGHT BEARING RESTRICTIONS: No  FALLS:  Has patient fallen in last 6 months? Yes. Number of falls 1 tripped before SI joint pain  LIVING ENVIRONMENT: Lives with: lives with their family Lives in: House/apartment Stairs: Yes: External: 6 steps; on right going up Has following equipment at home: None  OCCUPATION: Librarian, academic. . Seated and driving  PLOF: Independent  PATIENT GOALS: flexibility, less pain  NEXT MD VISIT: as needed  OBJECTIVE:  Note: Objective measures were completed at Evaluation unless otherwise noted.  DIAGNOSTIC FINDINGS:  None found in chart  PATIENT SURVEYS:  FOTO Primary score 54% with goal of 70%  COGNITION: Overall cognitive status: Within functional limits for tasks assessed     SENSATION: Tingling into left foot  MUSCLE LENGTH: Hamstrings:   POSTURE: No Significant postural limitations  PALPATION: Moderate TTP about left SI and glute, min TTP right SI  LUMBAR ROM:   WFL  LOWER EXTREMITY ROM:    WFL  LOWER EXTREMITY MMT:    MMT Right eval Left eval  Hip flexion 51.9 53.5  Hip extension    Hip abduction  Norm 38lb 29.3 29.0  Hip adduction    Hip internal rotation    Hip external rotation    Knee flexion    Knee extension 40.7 55.1  Ankle dorsiflexion    Ankle plantarflexion    Ankle inversion    Ankle eversion     (Blank rows = not tested)  LUMBAR SPECIAL TESTS:  Straight leg raise test: Negative, Slump test: Positive, and Thomas test: Negative FABER pos  left (mild discomfort) Distraction test neg   FUNCTIONAL TESTS:  5 times sit to stand: 13.29 Timed up and go (TUG): 9.27 4 Stage [place] test: passed  GAIT: unlimited  TODAY'S TREATMENT:                                                                                                                              Eval   PATIENT EDUCATION:  Education details: Discussed eval findings, rehab rationale, aquatic program progression/POC and pools in area. Patient is in agreement  Person educated: Patient Education method: Explanation Education comprehension: verbalized understanding  HOME EXERCISE PROGRAM: TBA  ASSESSMENT:  CLINICAL IMPRESSION: Patient is a 67 y.o. f who was seen today for physical therapy evaluation and treatment for LBP. No diagnostics in chart but pt reports she was told that her SIJ is was inflammed.  She took a steroid dose pack which initially resolved pain completely for a few days but it has started to creep back. As per testing above pt may have left SI involvement although pain very minimal with Pearlean Brownie test and neg with compression test. She did have a positive slump test on left indicating possible sciatic nerve irritation.  She does have history of L1 compression fx and reports OA throughout all of her joints including her spine. She will benefit from skilled PT for introduction to aquatic intervention to gain knowledge on benefits and be instructed on an HEP for future use if desired (no pool access) and then land based for lumbosacral strengthening and mobilization to reduce pain sensitivity and return pt to PLOF.  OBJECTIVE IMPAIRMENTS: decreased knowledge of condition, decreased strength, impaired flexibility, and pain.   ACTIVITY LIMITATIONS: transfers and bed mobility  REHAB POTENTIAL: Good  CLINICAL DECISION MAKING: Evolving/moderate complexity  EVALUATION COMPLEXITY: Moderate   GOALS: Goals reviewed with patient? No  SHORT TERM GOALS: Target  date: 01/12/24  Pt will demonstrate understanding of the benefits of aquatic therapy intervention using the properties of water to improve strength, balance and control pain as a tool to manage condition. Baseline: Goal status: INITIAL  2.  Pt to improve on foto score by at least 10% to demonstrate improved perception of functional mobility Baseline: 54% Goal status: INITIAL  3.  Pt will be indep with final HEP's (land and aquatic as appropriate) for continued management of condition Baseline: none Goal status: INITIAL  4.   Pt will improve strength in hip abd by at least 5 lbs to demonstrate improved overall physical function Baseline: see chart Goal status: INITIAL  5.  Pt will have a reduction in pain by 2 NPRS for improved comfort with ADL's and functional mobility Baseline: see chart Goal status: INITIAL    LONG TERM GOALS: To be set as approp at re-cert    PLAN:  PT FREQUENCY: 1-2x/week  PT DURATION: 6 weeks  PLANNED INTERVENTIONS: 97164- PT Re-evaluation, 97110-Therapeutic exercises, 97530- Therapeutic activity, 97112- Neuromuscular re-education, 97535- Self Care, 16109- Manual therapy, [id number]- Gait training, [phone]- Orthotic Fit/training, [id number]- Aquatic Therapy, 97014- Electrical stimulation (unattended), [phone]- Ionotophoresis 4mg /ml Dexamethasone, Patient/Family education, Balance training, Stair training, Taping, Dry Needling, Joint mobilization, Spinal manipulation, DME instructions, Cryotherapy, and Moist heat.  PLAN FOR NEXT SESSION:  3 aquatic, remainder land based for lumbosacral ROM and strengthening, pain management   Geni Bers, PT 11/30/2023, 5:54 PM

## 2023-12-14 DIAGNOSIS — M25512 Pain in left shoulder: Secondary | ICD-10-CM | POA: Diagnosis not present

## 2023-12-14 DIAGNOSIS — M6281 Muscle weakness (generalized): Secondary | ICD-10-CM | POA: Diagnosis not present

## 2023-12-18 ENCOUNTER — Encounter: Payer: Self-pay | Admitting: Endocrinology

## 2023-12-21 ENCOUNTER — Ambulatory Visit (HOSPITAL_BASED_OUTPATIENT_CLINIC_OR_DEPARTMENT_OTHER): Payer: Medicare PPO | Attending: Orthopedic Surgery | Admitting: Physical Therapy

## 2023-12-21 ENCOUNTER — Encounter (HOSPITAL_BASED_OUTPATIENT_CLINIC_OR_DEPARTMENT_OTHER): Payer: Self-pay | Admitting: Physical Therapy

## 2023-12-21 DIAGNOSIS — M5459 Other low back pain: Secondary | ICD-10-CM | POA: Diagnosis present

## 2023-12-21 DIAGNOSIS — R2689 Other abnormalities of gait and mobility: Secondary | ICD-10-CM | POA: Insufficient documentation

## 2023-12-21 NOTE — Therapy (Signed)
 OUTPATIENT PHYSICAL THERAPY THORACOLUMBAR TREATMENT   Patient Name: SUJEY GUNDRY MRN: [id number] DOB:1956-01-16, 68 y.o., female Today's Date: 12/21/2023  END OF SESSION:  PT End of Session - 12/21/23 1540     Visit Number 2    Number of Visits 12    Date for PT Re-Evaluation 01/12/24    Authorization Type BCBS medicare    Progress Note Due on Visit 10    PT Start Time 1530    PT Stop Time 1615    PT Time Calculation (min) 45 min    Activity Tolerance Patient tolerated treatment well    Behavior During Therapy WFL for tasks assessed/performed             Past Medical History:  Diagnosis Date   Arthritis    Asthma    CAUSED BY ACID REFLUX   Back pain    DDD   Bronchitis 2012   Cancer (HCC)    Constipation    Dizziness    r/t menstrual cycle;was given Meclizine and no problems in over a yr   GERD (gastroesophageal reflux disease) 2013   Headache(784.0)    occasionally   Hx of seasonal allergies    takes Allegra daily   Hyperlipidemia    takes Simvastatin  nightly   Insomnia    d/t neck pain and menopause   Joint pain    Joint swelling    Pneumonia 2012   Past Surgical History:  Procedure Laterality Date   ANTERIOR CERVICAL DECOMP/DISCECTOMY FUSION  06/07/2012   Procedure: ANTERIOR CERVICAL DECOMPRESSION/DISCECTOMY FUSION 2 LEVELS;  Surgeon: Donaciano Sprang, MD;  Location: [facility];  Service: Orthopedics;  Laterality: N/A;  ACDF C4-5, 5-6   ANTERIOR CERVICAL DECOMP/DISCECTOMY FUSION N/A 05/27/2020   Procedure: ANTERIOR CERVICAL DECOMPRESSION/DISCECTOMY FUSION CERVICAL SIX-SEVEN;  Surgeon: Sprang Donaciano, MD;  Location: [facility];  Service: Orthopedics;  Laterality: N/A;  2.5 hrs   CARPAL TUNNEL RELEASE     bilateral   COLONOSCOPY     COLONOSCOPY WITH PROPOFOL  N/A 08/15/2019   Procedure: COLONOSCOPY WITH PROPOFOL ;  Surgeon: Celestia Agent, MD;  Location: [facility];  Service: Endoscopy;  Laterality: N/A;   EYE SURGERY Bilateral    CATARACT SURGERY   FLEXIBLE  SIGMOIDOSCOPY  at age 61   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Labauer @ age 48   JOINT REPLACEMENT  2019   LEFT HAND [facility] JOINT REPLACMENT   LEFT HAND CYST REMOVED  2019   REVERSE SHOULDER ARTHROPLASTY Right 09/15/2022   Procedure: REVERSE SHOULDER ARTHROPLASTY;  Surgeon: Melita Drivers, MD;  Location: [facility];  Service: Orthopedics;  Laterality: Right;    ROTATOR CUFF REPAIR Right 12/2021   TONSILLECTOMY     at age 30   Patient Active Problem List   Diagnosis Date Noted   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   Cervical radiculopathy 05/27/2020   Mild intermittent asthma 12/04/2015   LPRD (laryngopharyngeal reflux disease) 12/04/2015   Allergic rhinoconjunctivitis 12/04/2015   Family history of colon cancer 02/06/2015   Chest pain 01/26/2015   Dyslipidemia 01/26/2015   GERD (gastroesophageal reflux disease) 01/26/2015   Bloating 10/16/2014   Constipation 10/16/2014   Redundant colon 10/16/2014    PCP: Olam Pinal MD  REFERRING PROVIDER: Donaciano Sprang MD  REFERRING DIAG: M54.50 (ICD-10-CM) - Low back pain, unspecified   Rationale for Evaluation and Treatment: Rehabilitation  THERAPY DIAG:  Other low back pain  Other abnormalities of gait and mobility  ONSET DATE: exacerbated x 1 month  SUBJECTIVE:  SUBJECTIVE STATEMENT: Pt reports she had some pain in Lt SI/back yesterday, but this morning it was much better.  She knows how to swim.  She doesn't have membership to gym, but can join one with [employer].     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  Etiology unknown.  Took prednisone and it is better (was 7-8/10 now 3/10). When it was bad it radiated into left foot tingling. It has been going on since before  Thanksgiving.  Now I have stiffness when I walk and some pain has returned.  Prednisone knocked it completely out for a few days. Have a long history of orthopedic issues have OA every where. Have had cervical surgery in 2013. No access to pool.  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  OP L1 compression fx in 2022 healed PAIN:  Are you having pain? no: NPRS scale: current 0/10; Pain location: left SI area  Pain description: Aggravating factors: STS; bed transfers; car transfers Relieving factors: prednisone, celebrex ; heating pad  PRECAUTIONS: None  RED FLAGS: None   WEIGHT BEARING RESTRICTIONS: No  FALLS:  Has patient fallen in last 6 months? Yes. 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  DIAGNOSTIC FINDINGS:  None found in chart  PATIENT SURVEYS:  FOTO Primary score 54% with goal of 70%  COGNITION: Overall cognitive status: Within functional limits for tasks assessed     SENSATION: Tingling into left foot  MUSCLE LENGTH: Hamstrings:   POSTURE: No Significant postural limitations  PALPATION: Moderate TTP about left SI and glute, min TTP right SI  LUMBAR ROM:   WFL  LOWER EXTREMITY ROM:    WFL  LOWER EXTREMITY MMT:    MMT Right eval Left eval  Hip flexion 51.9 53.5  Hip extension    Hip abduction  Norm 38lb 29.3 29.0  Hip adduction    Hip internal rotation    Hip external rotation    Knee flexion    Knee extension 40.7 55.1  Ankle dorsiflexion    Ankle plantarflexion    Ankle inversion    Ankle eversion      (Blank rows = not tested)  LUMBAR SPECIAL TESTS:  Straight leg raise test: Negative, Slump test: Positive, and Thomas test: Negative FABER pos left (mild discomfort) Distraction test neg   FUNCTIONAL TESTS:  5 times sit to stand: 13.29 Timed up and go (TUG): 9.27 4 Stage Blanace test: passed  GAIT: unlimited  TODAY'S TREATMENT:  Pt seen for aquatic therapy today.  Treatment took place in water 3.5-4.75 ft in depth at the [facility] pool. Temp of water was 91.  Pt entered/exited the pool via stairs independently with bilat rail. -in 77ft 6 water (chest deep) * walking unsupported forward / backward, arms relaxed * side stepping with arm addct/ abdct -> with rainbow hand floats-> yellow hand floats * staggered stance with tricep press downs with rainbow hand floats x 10 each; bilat horiz abdct/ addct  * UE support on rainbow hand floats:  leg swings into hip flex/ext, hip abdct/ addct - 10 each; heel raises x 10;  squats x 5 * UE on wall: split squats x 5 each ->  walking split squats with UE On rainbow hand floats x 1 lap * straddling yellow noodle:  cycling with breast stroke arms x 3 laps;  hip abdct/ addct  ; cross country ski * TrA set with short hollow noodle pull down to thighs (too easy) ->  single/ bilat rainbow hand float pull down to thighs x 5  Pt requires the buoyancy and hydrostatic pressure of water for support, and to offload joints by unweighting joint load by at least 50 % in navel deep water and by at least 75-80% in chest to neck deep water.  Viscosity of the water is needed for resistance of strengthening. Water current perturbations provides challenge to standing balance requiring increased core activation.  PATIENT EDUCATION:  Education details: Intro to aquatic therapy  Person educated: Patient Education method:  Explanation Education comprehension: verbalized understanding  HOME EXERCISE PROGRAM: TBA  ASSESSMENT:  CLINICAL IMPRESSION: Pt is confident in aquatic setting and can take direction from therapist on deck.  She tolerated all exercises well, without production of symptoms.  Goals are ongoing.     Initial evaluation:  Patient is a 68 y.o. f who was seen today for physical therapy evaluation and treatment for LBP. No diagnostics in chart but pt reports she was told that her SIJ is was inflammed.  She took a steroid dose pack which initially resolved pain completely for a few days but it has started to creep back. As per testing above pt may have left SI involvement although pain very minimal with Deri test and neg with compression test. She did have a positive slump test on left indicating possible sciatic nerve irritation.  She does have history of L1 compression fx and reports OA throughout all of her joints including her spine. She will benefit from skilled PT for introduction to aquatic intervention to gain knowledge on benefits and be instructed on an HEP for future use if desired (no pool access) and then land based for lumbosacral strengthening and mobilization to reduce pain sensitivity and return pt to PLOF.  OBJECTIVE IMPAIRMENTS: decreased knowledge of condition, decreased strength, impaired flexibility, and pain.   ACTIVITY LIMITATIONS: transfers and bed mobility  REHAB POTENTIAL: Good  CLINICAL DECISION MAKING: Evolving/moderate complexity  EVALUATION COMPLEXITY: Moderate   GOALS: Goals reviewed with patient? No  SHORT TERM GOALS: Target date: 01/12/24  Pt will demonstrate understanding of the benefits of aquatic therapy intervention using the properties of water to improve strength, balance and control pain as a tool to manage condition. Baseline: Goal status: INITIAL  2.  Pt to improve on foto score by at least 10% to demonstrate improved perception of functional  mobility Baseline: 54% Goal status: INITIAL  3.  Pt will be indep with final HEP's (land and aquatic as appropriate) for  continued management of condition Baseline: none Goal status: INITIAL  4.   Pt will improve strength in hip abd by at least 5 lbs to demonstrate improved overall physical function Baseline: see chart Goal status: INITIAL  5.  Pt will have a reduction in pain by 2 NPRS for improved comfort with ADL's and functional mobility Baseline: see chart Goal status: INITIAL    LONG TERM GOALS: To be set as approp at re-cert    PLAN:  PT FREQUENCY: 1-2x/week  PT DURATION: 6 weeks  PLANNED INTERVENTIONS: 97164- PT Re-evaluation, 97110-Therapeutic exercises, 97530- Therapeutic activity, 97112- Neuromuscular re-education, 97535- Self Care, 02859- Manual therapy, [id number]- Gait training, [phone]- Orthotic Fit/training, [id number]- Aquatic Therapy, 97014- Electrical stimulation (unattended), [phone]- Ionotophoresis 4mg /ml Dexamethasone , Patient/Family education, Balance training, Stair training, Taping, Dry Needling, Joint mobilization, Spinal manipulation, DME instructions, Cryotherapy, and Moist heat.  PLAN FOR NEXT SESSION:  3 aquatic, remainder land based for lumbosacral ROM and strengthening, pain management   Delon Aquas, PTA 12/21/23 4:44 PM [facility] [address] [place], [place], [address] Phone: [phone]   Fax:  [phone]

## 2023-12-26 ENCOUNTER — Ambulatory Visit (HOSPITAL_BASED_OUTPATIENT_CLINIC_OR_DEPARTMENT_OTHER): Payer: Medicare PPO

## 2023-12-27 ENCOUNTER — Encounter: Payer: Self-pay | Admitting: Endocrinology

## 2023-12-29 ENCOUNTER — Ambulatory Visit (HOSPITAL_BASED_OUTPATIENT_CLINIC_OR_DEPARTMENT_OTHER): Payer: Medicare Other | Admitting: Physical Therapy

## 2024-01-01 ENCOUNTER — Encounter: Payer: Self-pay | Admitting: Endocrinology

## 2024-01-02 ENCOUNTER — Ambulatory Visit (HOSPITAL_BASED_OUTPATIENT_CLINIC_OR_DEPARTMENT_OTHER): Payer: Medicare PPO

## 2024-01-04 ENCOUNTER — Encounter (HOSPITAL_BASED_OUTPATIENT_CLINIC_OR_DEPARTMENT_OTHER): Payer: Self-pay | Admitting: Physical Therapy

## 2024-01-04 ENCOUNTER — Ambulatory Visit (HOSPITAL_BASED_OUTPATIENT_CLINIC_OR_DEPARTMENT_OTHER): Payer: Medicare PPO | Admitting: Physical Therapy

## 2024-01-04 DIAGNOSIS — M5459 Other low back pain: Secondary | ICD-10-CM | POA: Diagnosis not present

## 2024-01-04 DIAGNOSIS — R2689 Other abnormalities of gait and mobility: Secondary | ICD-10-CM

## 2024-01-04 NOTE — Therapy (Signed)
OUTPATIENT PHYSICAL THERAPY THORACOLUMBAR TREATMENT   Patient Name: Morgan Johnston MRN: [id number] DOB:27-Jul-1956, 68 y.o., female Today's Date: 01/04/2024  END OF SESSION:  PT End of Session - 01/04/24 1624     Visit Number 3    Number of Visits 12    Date for PT Re-Evaluation 01/12/24    Authorization Type BCBS medicare    Progress Note Due on Visit 10    PT Start Time 1622   arrives late   PT Stop Time 1700    PT Time Calculation (min) 38 min    Activity Tolerance Patient tolerated treatment well    Behavior During Therapy WFL for tasks assessed/performed              Past Medical History:  Diagnosis Date   Arthritis    Asthma    CAUSED BY ACID REFLUX   Back pain    DDD   Bronchitis 2012   Cancer (HCC)    Constipation    Dizziness    r/t menstrual cycle;was given Meclizine and no problems in over a yr   GERD (gastroesophageal reflux disease) 2013   Headache(784.0)    occasionally   Hx of seasonal allergies    takes Allegra daily   Hyperlipidemia    takes Simvastatin nightly   Insomnia    d/t neck pain and menopause   Joint pain    Joint swelling    Pneumonia 2012   Past Surgical History:  Procedure Laterality Date   ANTERIOR CERVICAL DECOMP/DISCECTOMY FUSION  06/07/2012   Procedure: ANTERIOR CERVICAL DECOMPRESSION/DISCECTOMY FUSION 2 LEVELS;  Surgeon: Venita Lick, MD;  Location: [facility];  Service: Orthopedics;  Laterality: N/A;  ACDF C4-5, 5-6   ANTERIOR CERVICAL DECOMP/DISCECTOMY FUSION N/A 05/27/2020   Procedure: ANTERIOR CERVICAL DECOMPRESSION/DISCECTOMY FUSION CERVICAL SIX-SEVEN;  Surgeon: Venita Lick, MD;  Location: [facility];  Service: Orthopedics;  Laterality: N/A;  2.5 hrs   CARPAL TUNNEL RELEASE     bilateral   COLONOSCOPY     COLONOSCOPY WITH PROPOFOL N/A 08/15/2019   Procedure: COLONOSCOPY WITH PROPOFOL;  Surgeon: Carman Ching, MD;  [facility];  Service: Endoscopy;  Laterality: N/A;   EYE SURGERY Bilateral    CATARACT SURGERY    FLEXIBLE SIGMOIDOSCOPY  at age 39   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[facility]auer @ age 31   JOINT REPLACEMENT  2019   LEFT HAND [facility] JOINT REPLACMENT   LEFT HAND CYST REMOVED  2019   REVERSE SHOULDER ARTHROPLASTY Right 09/15/2022   Procedure: REVERSE SHOULDER ARTHROPLASTY;  Surgeon: Francena Hanly, MD;  [facility]: [facility];  Service: Orthopedics;  Laterality: Right;    ROTATOR CUFF REPAIR Right 12/2021   TONSILLECTOMY     at age 39   Patient Active Problem List   Diagnosis Date Noted   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   Cervical radiculopathy 05/27/2020   Mild intermittent asthma 12/04/2015   LPRD (laryngopharyngeal reflux disease) 12/04/2015   Allergic rhinoconjunctivitis 12/04/2015   Family history of colon cancer 02/06/2015   Chest pain 01/26/2015   Dyslipidemia 01/26/2015   GERD (gastroesophageal reflux disease) 01/26/2015   Bloating 10/16/2014   Constipation 10/16/2014   Redundant colon 10/16/2014    PCP: Sigmund Hazel MD  REFERRING PROVIDER: Venita Lick MD  REFERRING DIAG: M54.50 (ICD-10-CM) - Low back pain, unspecified   Rationale for Evaluation and Treatment: Rehabilitation  THERAPY DIAG:  Other low back pain  Other abnormalities of gait and mobility  ONSET DATE: exacerbated x  1 month  SUBJECTIVE:                                                                                                                                                                                           SUBJECTIVE STATEMENT: "Not hurting today.  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  Etiology unknown.  Took prednisone and it is better (was 7-8/10 now 3/10). When it was bad it radiated into left foot tingling. It has been going on since before Thanksgiving.  Now I have stiffness when I walk and some pain has returned.  Prednisone knocked  it completely out for a few days. Have a long history of orthopedic issues have OA every where. Have had cervical surgery in 2013. No access to pool.  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  OP L1 compression fx in 2022 healed PAIN:  Are you having pain? no: NPRS scale: current 0/10; Pain location: left SI area  Pain description: Aggravating factors: STS; bed transfers; car transfers Relieving factors: prednisone, celebrex; heating pad  PRECAUTIONS: None  RED FLAGS: None   WEIGHT BEARING RESTRICTIONS: No  FALLS:  Has patient fallen in last 6 months? Yes. 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  DIAGNOSTIC FINDINGS:  None found in chart  PATIENT SURVEYS:  FOTO Primary score 54% with goal of 70%  COGNITION: Overall cognitive status: Within functional limits for tasks assessed     SENSATION: Tingling into left foot  MUSCLE LENGTH: Hamstrings:   POSTURE: No Significant postural limitations  PALPATION: Moderate TTP about left SI and glute, min TTP right SI  LUMBAR ROM:   WFL  LOWER EXTREMITY ROM:    WFL  LOWER EXTREMITY MMT:    MMT Right eval Left eval  Hip flexion 51.9 53.5  Hip extension    Hip abduction  Norm 38lb 29.3 29.0  Hip adduction    Hip internal rotation    Hip external rotation    Knee flexion    Knee extension 40.7 55.1  Ankle dorsiflexion    Ankle plantarflexion  Ankle inversion    Ankle eversion     (Blank rows = not tested)  LUMBAR SPECIAL TESTS:  Straight leg raise test: Negative,  Slump test: Positive, and Thomas test: Negative FABER pos left (mild discomfort) Distraction test neg   FUNCTIONAL TESTS:  5 times sit to stand: 13.29 Timed up and go (TUG): 9.27 4 Stage Blanace test: passed  GAIT: unlimited  TODAY'S TREATMENT:                                                                                                                              Pt seen for aquatic therapy today.  Treatment took place in water 3.5-4.75 ft in depth at the [facility] pool. Temp of water was 91.  Pt entered/exited the pool via stairs independently with bilat rail.  -in 10ft 6" water (chest deep) * walking unsupported forward / backward, arms relaxed *L stretch x 3 knee slightly bent x 3; L stretch with tail wagging; with knees straight for hamstring stretch  *Pelvic tilting, hip hiking and rotation sitting on noodle swing like *HB carry rainbow HB forward and back x 2 ea *Side lunge x 4 widths (no HB) * TrA set with short hollow noodle pull down to thighs (too easy) ->  single/ bilat rainbow hand float pull down to thighs x 5 *STS from 3rd step x 5-> 4th step x8 * straddling yellow noodle:  cycling with breast stroke arms x 4 laps;  hip abdct/ addct  ; cross country ski  Pt requires the buoyancy and hydrostatic pressure of water for support, and to offload joints by unweighting joint load by at least 50 % in navel deep water and by at least 75-80% in chest to neck deep water.  Viscosity of the water is needed for resistance of strengthening. Water current perturbations provides challenge to standing balance requiring increased core activation.  PATIENT EDUCATION:  Education details: Intro to aquatic therapy  Person educated: Patient Education method: Explanation Education comprehension: verbalized understanding  HOME EXERCISE PROGRAM: TBA  ASSESSMENT:  CLINICAL IMPRESSION: Pt reports decreased back pain but increased right shoulder pain (impingement) today.   Progressed core strengthening as able trying to decrease use of rue.  She has good toleration throughout without increase of any pain.  Initiated stretching of  core and pelvis. She will be seen land based next session.  Will make more appts through the 3rd week in Feb.     Initial evaluation:  Patient is a 68 y.o. f who was seen today for physical therapy evaluation and treatment for LBP. No diagnostics in chart but pt reports she was told that her SIJ is was inflammed.  She took a steroid dose pack which initially resolved pain completely for a few days but it has started to creep back. As per testing above pt may have left SI involvement although pain very minimal with Pearlean Brownie test and neg with compression test. She did have a  positive slump test on left indicating possible sciatic nerve irritation.  She does have history of L1 compression fx and reports OA throughout all of her joints including her spine. She will benefit from skilled PT for introduction to aquatic intervention to gain knowledge on benefits and be instructed on an HEP for future use if desired (no pool access) and then land based for lumbosacral strengthening and mobilization to reduce pain sensitivity and return pt to PLOF.  OBJECTIVE IMPAIRMENTS: decreased knowledge of condition, decreased strength, impaired flexibility, and pain.   ACTIVITY LIMITATIONS: transfers and bed mobility  REHAB POTENTIAL: Good  CLINICAL DECISION MAKING: Evolving/moderate complexity  EVALUATION COMPLEXITY: Moderate   GOALS: Goals reviewed with patient? No  SHORT TERM GOALS: Target date: 01/12/24  Pt will demonstrate understanding of the benefits of aquatic therapy intervention using the properties of water to improve strength, balance and control pain as a tool to manage condition. Baseline: Goal status: INITIAL  2.  Pt to improve on foto score by at least 10% to demonstrate improved perception of functional mobility Baseline: 54% Goal  status: INITIAL  3.  Pt will be indep with final HEP's (land and aquatic as appropriate) for continued management of condition Baseline: none Goal status: INITIAL  4.   Pt will improve strength in hip abd by at least 5 lbs to demonstrate improved overall physical function Baseline: see chart Goal status: INITIAL  5.  Pt will have a reduction in pain by 2 NPRS for improved comfort with ADL's and functional mobility Baseline: see chart Goal status: INITIAL    LONG TERM GOALS: To be set as approp at re-cert    PLAN:  PT FREQUENCY: 1-2x/week  PT DURATION: 6 weeks  PLANNED INTERVENTIONS: 97164- PT Re-evaluation, 97110-Therapeutic exercises, 97530- Therapeutic activity, 97112- Neuromuscular re-education, 97535- Self Care, 16109- Manual therapy, [id number]- Gait training, [phone]- Orthotic Fit/training, [id number]- Aquatic Therapy, 97014- Electrical stimulation (unattended), [phone]- Ionotophoresis 4mg /ml Dexamethasone, Patient/Family education, Balance training, Stair training, Taping, Dry Needling, Joint mobilization, Spinal manipulation, DME instructions, Cryotherapy, and Moist heat.  PLAN FOR NEXT SESSION:  3 aquatic, remainder land based for lumbosacral ROM and strengthening, pain management   Rushie Chestnut) Sonu Kruckenberg MPT 01/04/24 5:08 PM [facility] [address] [place], [place], [address] Phone: [phone]   Fax:  [phone]

## 2024-01-09 ENCOUNTER — Ambulatory Visit (HOSPITAL_BASED_OUTPATIENT_CLINIC_OR_DEPARTMENT_OTHER): Payer: Medicare PPO | Admitting: Physical Therapy

## 2024-01-09 ENCOUNTER — Telehealth (HOSPITAL_BASED_OUTPATIENT_CLINIC_OR_DEPARTMENT_OTHER): Payer: Self-pay | Admitting: Physical Therapy

## 2024-01-09 NOTE — Telephone Encounter (Signed)
Patient cancelled appointment today. Spoke with patient regarding missed appointment and since she is near the end of her POC she will need to be reassessed for extension on POC  if wanting to continue PT vs. Discharge.   3:01 PM, 01/09/24 Wyman Songster PT, DPT Physical Therapist at [facility]

## 2024-02-09 DIAGNOSIS — M7541 Impingement syndrome of right shoulder: Secondary | ICD-10-CM | POA: Diagnosis not present

## 2024-02-09 DIAGNOSIS — M75101 Unspecified rotator cuff tear or rupture of right shoulder, not specified as traumatic: Secondary | ICD-10-CM | POA: Diagnosis not present

## 2024-02-09 DIAGNOSIS — M19011 Primary osteoarthritis, right shoulder: Secondary | ICD-10-CM | POA: Diagnosis not present

## 2024-02-26 DIAGNOSIS — M25511 Pain in right shoulder: Secondary | ICD-10-CM | POA: Diagnosis not present

## 2024-02-26 DIAGNOSIS — M25611 Stiffness of right shoulder, not elsewhere classified: Secondary | ICD-10-CM | POA: Diagnosis not present

## 2024-02-26 DIAGNOSIS — M6281 Muscle weakness (generalized): Secondary | ICD-10-CM | POA: Diagnosis not present

## 2024-03-06 DIAGNOSIS — M810 Age-related osteoporosis without current pathological fracture: Secondary | ICD-10-CM | POA: Diagnosis not present

## 2024-03-06 DIAGNOSIS — M4850XA Collapsed vertebra, not elsewhere classified, site unspecified, initial encounter for fracture: Secondary | ICD-10-CM | POA: Diagnosis not present

## 2024-03-06 DIAGNOSIS — E559 Vitamin D deficiency, unspecified: Secondary | ICD-10-CM | POA: Diagnosis not present

## 2024-03-11 DIAGNOSIS — M6281 Muscle weakness (generalized): Secondary | ICD-10-CM | POA: Diagnosis not present

## 2024-03-11 DIAGNOSIS — M25511 Pain in right shoulder: Secondary | ICD-10-CM | POA: Diagnosis not present

## 2024-03-11 DIAGNOSIS — M25611 Stiffness of right shoulder, not elsewhere classified: Secondary | ICD-10-CM | POA: Diagnosis not present

## 2024-03-26 DIAGNOSIS — M25511 Pain in right shoulder: Secondary | ICD-10-CM | POA: Diagnosis not present

## 2024-03-26 DIAGNOSIS — M25611 Stiffness of right shoulder, not elsewhere classified: Secondary | ICD-10-CM | POA: Diagnosis not present

## 2024-03-26 DIAGNOSIS — M6281 Muscle weakness (generalized): Secondary | ICD-10-CM | POA: Diagnosis not present

## 2024-03-27 DIAGNOSIS — Z1283 Encounter for screening for malignant neoplasm of skin: Secondary | ICD-10-CM | POA: Diagnosis not present

## 2024-03-27 DIAGNOSIS — C44519 Basal cell carcinoma of skin of other part of trunk: Secondary | ICD-10-CM | POA: Diagnosis not present

## 2024-03-27 DIAGNOSIS — D225 Melanocytic nevi of trunk: Secondary | ICD-10-CM | POA: Diagnosis not present

## 2024-03-27 DIAGNOSIS — L821 Other seborrheic keratosis: Secondary | ICD-10-CM | POA: Diagnosis not present

## 2024-03-27 DIAGNOSIS — Z85828 Personal history of other malignant neoplasm of skin: Secondary | ICD-10-CM | POA: Diagnosis not present

## 2024-03-27 DIAGNOSIS — L820 Inflamed seborrheic keratosis: Secondary | ICD-10-CM | POA: Diagnosis not present

## 2024-03-27 DIAGNOSIS — Z08 Encounter for follow-up examination after completed treatment for malignant neoplasm: Secondary | ICD-10-CM | POA: Diagnosis not present

## 2024-04-10 DIAGNOSIS — M6281 Muscle weakness (generalized): Secondary | ICD-10-CM | POA: Diagnosis not present

## 2024-04-10 DIAGNOSIS — M25611 Stiffness of right shoulder, not elsewhere classified: Secondary | ICD-10-CM | POA: Diagnosis not present

## 2024-04-10 DIAGNOSIS — M25511 Pain in right shoulder: Secondary | ICD-10-CM | POA: Diagnosis not present

## 2024-04-15 DIAGNOSIS — M810 Age-related osteoporosis without current pathological fracture: Secondary | ICD-10-CM | POA: Diagnosis not present

## 2024-04-22 DIAGNOSIS — M25611 Stiffness of right shoulder, not elsewhere classified: Secondary | ICD-10-CM | POA: Diagnosis not present

## 2024-04-22 DIAGNOSIS — M25511 Pain in right shoulder: Secondary | ICD-10-CM | POA: Diagnosis not present

## 2024-04-22 DIAGNOSIS — M6281 Muscle weakness (generalized): Secondary | ICD-10-CM | POA: Diagnosis not present

## 2024-04-24 DIAGNOSIS — Z85828 Personal history of other malignant neoplasm of skin: Secondary | ICD-10-CM | POA: Diagnosis not present

## 2024-04-24 DIAGNOSIS — Z08 Encounter for follow-up examination after completed treatment for malignant neoplasm: Secondary | ICD-10-CM | POA: Diagnosis not present

## 2024-05-06 DIAGNOSIS — M25611 Stiffness of right shoulder, not elsewhere classified: Secondary | ICD-10-CM | POA: Diagnosis not present

## 2024-05-06 DIAGNOSIS — M6281 Muscle weakness (generalized): Secondary | ICD-10-CM | POA: Diagnosis not present

## 2024-05-06 DIAGNOSIS — M25511 Pain in right shoulder: Secondary | ICD-10-CM | POA: Diagnosis not present

## 2024-05-16 DIAGNOSIS — E782 Mixed hyperlipidemia: Secondary | ICD-10-CM | POA: Diagnosis not present

## 2024-05-16 DIAGNOSIS — Z0000 Encounter for general adult medical examination without abnormal findings: Secondary | ICD-10-CM | POA: Diagnosis not present

## 2024-05-16 DIAGNOSIS — J4520 Mild intermittent asthma, uncomplicated: Secondary | ICD-10-CM | POA: Diagnosis not present

## 2024-05-16 DIAGNOSIS — I2510 Atherosclerotic heart disease of native coronary artery without angina pectoris: Secondary | ICD-10-CM | POA: Diagnosis not present

## 2024-05-16 DIAGNOSIS — M810 Age-related osteoporosis without current pathological fracture: Secondary | ICD-10-CM | POA: Diagnosis not present

## 2024-05-16 DIAGNOSIS — Z6827 Body mass index (BMI) 27.0-27.9, adult: Secondary | ICD-10-CM | POA: Diagnosis not present

## 2024-05-16 DIAGNOSIS — E663 Overweight: Secondary | ICD-10-CM | POA: Diagnosis not present

## 2024-05-16 DIAGNOSIS — Z1331 Encounter for screening for depression: Secondary | ICD-10-CM | POA: Diagnosis not present

## 2024-05-16 DIAGNOSIS — R7303 Prediabetes: Secondary | ICD-10-CM | POA: Diagnosis not present

## 2024-05-20 DIAGNOSIS — M25611 Stiffness of right shoulder, not elsewhere classified: Secondary | ICD-10-CM | POA: Diagnosis not present

## 2024-05-20 DIAGNOSIS — M6281 Muscle weakness (generalized): Secondary | ICD-10-CM | POA: Diagnosis not present

## 2024-05-20 DIAGNOSIS — M25511 Pain in right shoulder: Secondary | ICD-10-CM | POA: Diagnosis not present

## 2024-05-29 DIAGNOSIS — Z4789 Encounter for other orthopedic aftercare: Secondary | ICD-10-CM | POA: Diagnosis not present

## 2024-06-03 DIAGNOSIS — M25511 Pain in right shoulder: Secondary | ICD-10-CM | POA: Diagnosis not present

## 2024-06-03 DIAGNOSIS — M6281 Muscle weakness (generalized): Secondary | ICD-10-CM | POA: Diagnosis not present

## 2024-06-03 DIAGNOSIS — M25611 Stiffness of right shoulder, not elsewhere classified: Secondary | ICD-10-CM | POA: Diagnosis not present

## 2024-06-06 DIAGNOSIS — Z1231 Encounter for screening mammogram for malignant neoplasm of breast: Secondary | ICD-10-CM | POA: Diagnosis not present

## 2024-07-09 DIAGNOSIS — Z01419 Encounter for gynecological examination (general) (routine) without abnormal findings: Secondary | ICD-10-CM | POA: Diagnosis not present

## 2024-07-09 DIAGNOSIS — Z1331 Encounter for screening for depression: Secondary | ICD-10-CM | POA: Diagnosis not present

## 2024-07-10 ENCOUNTER — Other Ambulatory Visit (HOSPITAL_BASED_OUTPATIENT_CLINIC_OR_DEPARTMENT_OTHER): Payer: Self-pay | Admitting: Endocrinology

## 2024-07-10 DIAGNOSIS — M810 Age-related osteoporosis without current pathological fracture: Secondary | ICD-10-CM

## 2024-08-20 DIAGNOSIS — I2510 Atherosclerotic heart disease of native coronary artery without angina pectoris: Secondary | ICD-10-CM | POA: Diagnosis not present

## 2024-08-23 DIAGNOSIS — M85852 Other specified disorders of bone density and structure, left thigh: Secondary | ICD-10-CM | POA: Diagnosis not present

## 2024-08-23 DIAGNOSIS — M85851 Other specified disorders of bone density and structure, right thigh: Secondary | ICD-10-CM | POA: Diagnosis not present

## 2024-10-03 DIAGNOSIS — H26493 Other secondary cataract, bilateral: Secondary | ICD-10-CM | POA: Diagnosis not present

## 2024-10-03 DIAGNOSIS — H40013 Open angle with borderline findings, low risk, bilateral: Secondary | ICD-10-CM | POA: Diagnosis not present

## 2024-10-14 DIAGNOSIS — M25511 Pain in right shoulder: Secondary | ICD-10-CM | POA: Diagnosis not present

## 2024-10-14 DIAGNOSIS — M25512 Pain in left shoulder: Secondary | ICD-10-CM | POA: Diagnosis not present

## 2024-10-17 DIAGNOSIS — M810 Age-related osteoporosis without current pathological fracture: Secondary | ICD-10-CM | POA: Diagnosis not present

## 2024-10-23 DIAGNOSIS — Z1283 Encounter for screening for malignant neoplasm of skin: Secondary | ICD-10-CM | POA: Diagnosis not present

## 2024-10-23 DIAGNOSIS — Z85828 Personal history of other malignant neoplasm of skin: Secondary | ICD-10-CM | POA: Diagnosis not present

## 2024-10-23 DIAGNOSIS — Z08 Encounter for follow-up examination after completed treatment for malignant neoplasm: Secondary | ICD-10-CM | POA: Diagnosis not present

## 2024-10-23 DIAGNOSIS — D225 Melanocytic nevi of trunk: Secondary | ICD-10-CM | POA: Diagnosis not present

## 2024-10-23 DIAGNOSIS — L820 Inflamed seborrheic keratosis: Secondary | ICD-10-CM | POA: Diagnosis not present
